# Patient Record
Sex: Female | Born: 2000 | Race: Black or African American | Hispanic: No | Marital: Single | Smoking: Never smoker
Health system: Southern US, Community
[De-identification: ages and names within clinical notes are randomized; demographics above are authoritative.]

## PROBLEM LIST (undated history)

## (undated) ENCOUNTER — Inpatient Hospital Stay (HOSPITAL_COMMUNITY): Payer: Self-pay

## (undated) DIAGNOSIS — N390 Urinary tract infection, site not specified: Secondary | ICD-10-CM

## (undated) DIAGNOSIS — I10 Essential (primary) hypertension: Secondary | ICD-10-CM

## (undated) DIAGNOSIS — A64 Unspecified sexually transmitted disease: Secondary | ICD-10-CM

## (undated) HISTORY — PX: NO PAST SURGERIES: SHX2092

## (undated) HISTORY — DX: Unspecified sexually transmitted disease: A64

---
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
## 2019-03-27 ENCOUNTER — Ambulatory Visit: Payer: Medicaid Other | Admitting: Family Medicine

## 2019-04-03 DIAGNOSIS — H5213 Myopia, bilateral: Secondary | ICD-10-CM | POA: Diagnosis not present

## 2019-04-03 DIAGNOSIS — H52223 Regular astigmatism, bilateral: Secondary | ICD-10-CM | POA: Diagnosis not present

## 2019-05-29 ENCOUNTER — Ambulatory Visit (INDEPENDENT_AMBULATORY_CARE_PROVIDER_SITE_OTHER): Payer: Medicaid Other | Admitting: Student in an Organized Health Care Education/Training Program

## 2019-05-29 ENCOUNTER — Other Ambulatory Visit (HOSPITAL_COMMUNITY)
Admission: RE | Admit: 2019-05-29 | Discharge: 2019-05-29 | Disposition: A | Discharge status: Home / Self Care | Payer: Medicaid Other | Source: Ambulatory Visit | Attending: Family Medicine | Admitting: Family Medicine

## 2019-05-29 ENCOUNTER — Other Ambulatory Visit: Payer: Self-pay

## 2019-05-29 VITALS — BP 120/80 | HR 88 | Ht 62.95 in | Wt 207.4 lb

## 2019-05-29 DIAGNOSIS — N926 Irregular menstruation, unspecified: Secondary | ICD-10-CM

## 2019-05-29 DIAGNOSIS — M549 Dorsalgia, unspecified: Secondary | ICD-10-CM

## 2019-05-29 DIAGNOSIS — N898 Other specified noninflammatory disorders of vagina: Secondary | ICD-10-CM | POA: Diagnosis not present

## 2019-05-29 DIAGNOSIS — G8929 Other chronic pain: Secondary | ICD-10-CM | POA: Diagnosis not present

## 2019-05-29 LAB — POCT WET PREP (WET MOUNT)
Clue Cells Wet Prep Whiff POC: POSITIVE
Trichomonas Wet Prep HPF POC: ABSENT

## 2019-05-29 LAB — POCT URINE PREGNANCY: Preg Test, Ur: NEGATIVE

## 2019-05-29 NOTE — Progress Notes (Signed)
   SUBJECTIVE:   CHIEF COMPLAINT / HPI: STD check, back pain  Back pain- present since epidural 1.5 years ago. Denies use of any medications to help. Located midline of entire spine. Improved when laying on her right side. Any type of activity worsens. This has impacted her daily activities. Her activities include daily squats without weight and crunches. Has worsened since onset. Denies rashes, paresthesias, radiation down legs or incontinence.   Vaginal discharge- intermittent over the past 2-3 months and resolves with use of "loose panties". Has a cleaning product odor. Denies any lesions unless she shaves her pubic hair. None are painful. Describes as clear. Denies pruritus. Regular periods, last one was in April sometime. Not concerned for pregnancy as her last intercourse was April 27th and was unprotected. She is not desiring pregnancy at this time. Does not take birth control as she thinks it makes her fat and lazy and gives her a rash on her thighs. Has PMH significant for gonorrhea.   PERTINENT  PMH / PSH: gonorrhea. Post-partum from cesarean delivery approximately 18 months ago.  OBJECTIVE:   BP 120/80   Pulse 88   Ht 5' 2.95" (1.599 m)   Wt 207 lb 6.4 oz (94.1 kg)   LMP 04/23/2019 (Approximate)   SpO2 100%   BMI 36.79 kg/m   General: NAD, pleasant, able to participate in exam Cardiac: RRR, normal heart sounds, no murmurs. 2+ radial and PT pulses bilaterally Respiratory: CTAB, normal effort, No wheezes, rales or rhonchi Abdomen: soft, nontender, nondistended, no hepatic or splenomegaly, +BS Back: negative for tenderness to palpation on paravertebral musculature. Tender to palpation of spinous processes from base of skull all the way to her sacrum. No rashes or abnormalities palpated. Endorses pain with movement but had full active ROM.  During pelvic exam, patient endorsed extreme discomfort in that position due to her back and had difficulty with laying still. Pelvic: negative  for external lesions.  Extremities: no edema or cyanosis. WWP. Skin: warm and dry, no rashes noted Neuro: alert and oriented x4, no focal deficits Psych: Normal affect and mood  ASSESSMENT/PLAN:   Back pain Highly unlikely 2/2 from epidural 1.5 years ago.  Given the nature of pain interfering with positioning for exam, ordering spine xray today.  Recommended trial of NSAIDs for symptomatic treatment  Vaginal discharge Exam positive for discharge. No active lesions. Urine preg screen neg - positive for chlamydia and BV. - prescribed azithro 1g and notify health department. Instructed to inform partners - prescribed metro 500 BID x7 days     Leeroy Bock, DO [facility]

## 2019-05-29 NOTE — Patient Instructions (Signed)
It was a pleasure to see you today!  To summarize our discussion for this visit:  We are testing for vaginal infections today. I will have results next week.  For your back, please go to hospital for xrays.   I also recommend you do back and core strengthening exercises to help prevent spinal pain  Some additional health maintenance measures we should update are: Health Maintenance Due  Topic Date Due  . COVID-19 Vaccine (1) Never done  .    Please return to our clinic to see [place] as needed.  Call the clinic at [phone] if your symptoms worsen or you have any concerns.   Thank you for allowing me to take part in your care,  Dr. Doristine Mango   Back Exercises These exercises help to make your trunk and back strong. They also help to keep the lower back flexible. Doing these exercises can help to prevent back pain or lessen existing pain.  If you have back pain, try to do these exercises 2-3 times each day or as told by your doctor.  As you get better, do the exercises once each day. Repeat the exercises more often as told by your doctor.  To stop back pain from coming back, do the exercises once each day, or as told by your doctor. Exercises Single knee to chest Do these steps 3-5 times in a row for each leg: 1. Lie on your back on a firm bed or the floor with your legs stretched out. 2. Bring one knee to your chest. 3. Grab your knee or thigh with both hands and hold them it in place. 4. Pull on your knee until you feel a gentle stretch in your lower back or buttocks. 5. Keep doing the stretch for 10-30 seconds. 6. Slowly let go of your leg and straighten it. Pelvic tilt Do these steps 5-10 times in a row: 1. Lie on your back on a firm bed or the floor with your legs stretched out. 2. Bend your knees so they point up to the ceiling. Your feet should be flat on the floor. 3. Tighten your lower belly (abdomen) muscles to press your lower back against the floor. This  will make your tailbone point up to the ceiling instead of pointing down to your feet or the floor. 4. Stay in this position for 5-10 seconds while you gently tighten your muscles and breathe evenly. Cat-cow Do these steps until your lower back bends more easily: 1. Get on your hands and knees on a firm surface. Keep your hands under your shoulders, and keep your knees under your hips. You may put padding under your knees. 2. Let your head hang down toward your chest. Tighten (contract) the muscles in your belly. Point your tailbone toward the floor so your lower back becomes rounded like the back of a cat. 3. Stay in this position for 5 seconds. 4. Slowly lift your head. Let the muscles of your belly relax. Point your tailbone up toward the ceiling so your back forms a sagging arch like the back of a cow. 5. Stay in this position for 5 seconds.  Press-ups Do these steps 5-10 times in a row: 1. Lie on your belly (face-down) on the floor. 2. Place your hands near your head, about shoulder-width apart. 3. While you keep your back relaxed and keep your hips on the floor, slowly straighten your arms to raise the top half of your body and lift your shoulders. Do not  use your back muscles. You may change where you place your hands in order to make yourself more comfortable. 4. Stay in this position for 5 seconds. 5. Slowly return to lying flat on the floor.  Bridges Do these steps 10 times in a row: 1. Lie on your back on a firm surface. 2. Bend your knees so they point up to the ceiling. Your feet should be flat on the floor. Your arms should be flat at your sides, next to your body. 3. Tighten your butt muscles and lift your butt off the floor until your waist is almost as high as your knees. If you do not feel the muscles working in your butt and the back of your thighs, slide your feet 1-2 inches farther away from your butt. 4. Stay in this position for 3-5 seconds. 5. Slowly lower your butt to  the floor, and let your butt muscles relax. If this exercise is too easy, try doing it with your arms crossed over your chest. Belly crunches Do these steps 5-10 times in a row: 1. Lie on your back on a firm bed or the floor with your legs stretched out. 2. Bend your knees so they point up to the ceiling. Your feet should be flat on the floor. 3. Cross your arms over your chest. 4. Tip your chin a little bit toward your chest but do not bend your neck. 5. Tighten your belly muscles and slowly raise your chest just enough to lift your shoulder blades a tiny bit off of the floor. Avoid raising your body higher than that, because it can put too much stress on your low back. 6. Slowly lower your chest and your head to the floor. Back lifts Do these steps 5-10 times in a row: 1. Lie on your belly (face-down) with your arms at your sides, and rest your forehead on the floor. 2. Tighten the muscles in your legs and your butt. 3. Slowly lift your chest off of the floor while you keep your hips on the floor. Keep the back of your head in line with the curve in your back. Look at the floor while you do this. 4. Stay in this position for 3-5 seconds. 5. Slowly lower your chest and your face to the floor. Contact a doctor if:  Your back pain gets a lot worse when you do an exercise.  Your back pain does not get better 2 hours after you exercise. If you have any of these problems, stop doing the exercises. Do not do them again unless your doctor says it is okay. Get help right away if:  You have sudden, very bad back pain. If this happens, stop doing the exercises. Do not do them again unless your doctor says it is okay. This information is not intended to replace advice given to you by your health care provider. Make sure you discuss any questions you have with your health care provider. Document Revised: 10/03/2017 Document Reviewed: 10/03/2017 Elsevier Patient Education  2020 [employer].

## 2019-06-02 LAB — CERVICOVAGINAL ANCILLARY ONLY
Chlamydia: POSITIVE — AB
Comment: NEGATIVE
Comment: NORMAL
Neisseria Gonorrhea: NEGATIVE

## 2019-06-03 DIAGNOSIS — M549 Dorsalgia, unspecified: Secondary | ICD-10-CM | POA: Insufficient documentation

## 2019-06-03 MED ORDER — AZITHROMYCIN 500 MG PO TABS: 1000.0000 mg | ORAL_TABLET | Freq: Every day | ORAL | 0 refills | Status: DC

## 2019-06-03 MED ORDER — METRONIDAZOLE 500 MG PO TABS: 500.0000 mg | ORAL_TABLET | Freq: Three times a day (TID) | ORAL | 0 refills | Status: DC

## 2019-06-03 NOTE — Assessment & Plan Note (Addendum)
Exam positive for discharge. No active lesions. Urine preg screen neg - positive for chlamydia and BV. - prescribed azithro 1g and notify health department. Instructed to inform partners - prescribed metro 500 BID x7 days

## 2019-06-03 NOTE — Assessment & Plan Note (Signed)
Highly unlikely 2/2 from epidural 1.5 years ago.  Given the nature of pain interfering with positioning for exam, ordering spine xray today.  Recommended trial of NSAIDs for symptomatic treatment

## 2019-07-03 ENCOUNTER — Telehealth: Payer: Self-pay

## 2019-07-03 NOTE — Telephone Encounter (Signed)
Patient calls nurse line stating she took a Plan B after unprotected intercourse, however, "was told she would start her period immediately and she has not." Patient reports she had unprotected IC on Saturday and took Plan B within the 48-72 hour time frame. Patient states one of her friends told her she would immediately start her period after taking. Patient is now concerned it did not work. Patients last LMP 06/17/2019. I advised patient to wait and see if she misses June period, and I am unaware if what her friend told her is true. However, every woman is different and may not have same experiences with medications. Patient advised to be patient and to call us if she misses her period, or has a positive home test. Patient agreed with plan.

## 2019-09-10 ENCOUNTER — Ambulatory Visit: Payer: Medicaid Other

## 2019-09-15 ENCOUNTER — Telehealth: Payer: Self-pay

## 2019-09-15 NOTE — Telephone Encounter (Signed)
Patient calls nurse line regarding late menstrual period. Patient reports LMP on 08/13/2019. Patient reports unprotected sex on 08/26/2019. Patient endorses nausea and headaches. Denies abdominal pain or cramps. Patient reports light pink discharge today but has not started a normal flow. Patient has not taken home pregnancy test yet.   Advised patient to take home pregnancy test. Patient requested to schedule appointment for evaluation. Scheduled in ATC for Thursday morning. Strict ED/ return precautions given.   To PCP  Veronda Prude, RN

## 2019-09-17 ENCOUNTER — Ambulatory Visit (INDEPENDENT_AMBULATORY_CARE_PROVIDER_SITE_OTHER): Payer: Medicaid Other | Admitting: Family Medicine

## 2019-09-17 ENCOUNTER — Other Ambulatory Visit: Payer: Self-pay

## 2019-09-17 VITALS — BP 124/86 | HR 106 | Ht 62.95 in | Wt 206.4 lb

## 2019-09-17 DIAGNOSIS — N926 Irregular menstruation, unspecified: Secondary | ICD-10-CM | POA: Diagnosis not present

## 2019-09-17 DIAGNOSIS — M549 Dorsalgia, unspecified: Secondary | ICD-10-CM

## 2019-09-17 LAB — POCT URINALYSIS DIP (MANUAL ENTRY)
Bilirubin, UA: NEGATIVE
Glucose, UA: NEGATIVE mg/dL
Ketones, POC UA: NEGATIVE mg/dL
Nitrite, UA: NEGATIVE
Protein Ur, POC: NEGATIVE mg/dL
Spec Grav, UA: 1.025 (ref 1.010–1.025)
Urobilinogen, UA: 0.2 E.U./dL
pH, UA: 5.5 (ref 5.0–8.0)

## 2019-09-17 LAB — POCT UA - MICROSCOPIC ONLY: Epithelial cells, urine per micros: >20

## 2019-09-17 LAB — POCT URINE PREGNANCY: Preg Test, Ur: NEGATIVE

## 2019-09-17 NOTE — Patient Instructions (Signed)
Thank you for coming to see me today. It was a pleasure. Today we talked about:   You are not pregnant.  Call if you have burning with urination, fever, chills, or your back pain doesn't improve.  If you have any questions or concerns, please do not hesitate to call the office at [phone].  Best,   Luis Abed, DO

## 2019-09-17 NOTE — Assessment & Plan Note (Signed)
Now has had period, and Upreg today negative.  Intercourse on 8/4, therefore this is true negative.  Advised patient of this who asked if serum was needed, but advised that given timing and period, she would not. Upreg is sensitive test for pregnancy.  Patient asked if there is a problem since she thought she was ovulating during intercourse on the 4th, but advised that tracking with a phone app is likely not accurate and that best way to tell if ovulating would be with testing temperature, monitoring discharge, and using ovulation tests.  Would not be concern about ability to conceive unless trying for 1 year without success.  She was reassured by this.  Advised that a period "2 days late" is not abnormal.

## 2019-09-17 NOTE — Assessment & Plan Note (Signed)
No CVA tenderness, urinary symptoms, fevers.  Pyelonephritis and UTI less likely.  Not clear UTI on UA and patient without symptoms aside from some back pain while on period, therefore will not treat.  Gave return precautions if urinary symptoms occur, has fever, back pain doesn't improve after period.  She voiced understanding.

## 2019-09-17 NOTE — Progress Notes (Signed)
    SUBJECTIVE:   CHIEF COMPLAINT / HPI:   Late Period Patient reports that she was 2 days late LMP 8/24, period before that was 7/22 Her period has been light She has been having low back cramps, headaches, and cramping Her periods are usually regular Last intercourse was 8/4 and didn't use protection She is trying to get pregnant No fevers, dysuria, hematuria, increased frequency or urgency  PERTINENT  PMH / PSH: GERD, PPD, S/P LTCS 2/2 breech presentation  OBJECTIVE:   BP 124/86   Pulse (!) 106   Ht 5' 2.95" (1.599 m)   Wt 206 lb 6.4 oz (93.6 kg)   LMP 09/16/2019 (Approximate)   SpO2 98%   BMI 36.62 kg/m    Physical Exam:  General: 19 y.o. female in NAD Cardio: RRR no m/r/g, HR 80s on exam Lungs: CTAB, no wheezing, no rhonchi, no crackles, no IWOB on RA Abdomen: Soft, non-tender to palpation, non-distended, positive bowel sounds, no CVA tenderness Skin: warm and dry Extremities: No edema   Results for orders placed or performed in visit on 09/17/19 (from the past 24 hour(s))  POCT urine pregnancy     Status: None   Collection Time: 09/17/19  9:50 AM  Result Value Ref Range   Preg Test, Ur Negative Negative  POCT urinalysis dipstick     Status: Abnormal   Collection Time: 09/17/19  9:50 AM  Result Value Ref Range   Color, UA yellow yellow   Clarity, UA cloudy (A) clear   Glucose, UA negative negative mg/dL   Bilirubin, UA negative negative   Ketones, POC UA negative negative mg/dL   Spec Grav, UA 3.810 1.751 - 1.025   Blood, UA large (A) negative   pH, UA 5.5 5.0 - 8.0   Protein Ur, POC negative negative mg/dL   Urobilinogen, UA 0.2 0.2 or 1.0 E.U./dL   Nitrite, UA Negative Negative   Leukocytes, UA Moderate (2+) (A) Negative  POCT UA - Microscopic Only     Status: Abnormal   Collection Time: 09/17/19  9:50 AM  Result Value Ref Range   WBC, Ur, HPF, POC 5-10    RBC, urine, microscopic OCCASIONAL    Bacteria, U Microscopic FEW    Epithelial cells,  urine per micros >20      ASSESSMENT/PLAN:   Late period Now has had period, and Upreg today negative.  Intercourse on 8/4, therefore this is true negative.  Advised patient of this who asked if serum was needed, but advised that given timing and period, she would not. Upreg is sensitive test for pregnancy.  Patient asked if there is a problem since she thought she was ovulating during intercourse on the 4th, but advised that tracking with a phone app is likely not accurate and that best way to tell if ovulating would be with testing temperature, monitoring discharge, and using ovulation tests.  Would not be concern about ability to conceive unless trying for 1 year without success.  She was reassured by this.  Advised that a period "2 days late" is not abnormal.  Back pain No CVA tenderness, urinary symptoms, fevers.  Pyelonephritis and UTI less likely.  Not clear UTI on UA and patient without symptoms aside from some back pain while on period, therefore will not treat.  Gave return precautions if urinary symptoms occur, has fever, back pain doesn't improve after period.  She voiced understanding.     Unknown Jim, DO [facility]

## 2019-09-25 ENCOUNTER — Ambulatory Visit: Payer: Medicaid Other | Admitting: Family Medicine

## 2019-10-06 ENCOUNTER — Ambulatory Visit: Payer: Medicaid Other | Admitting: Student in an Organized Health Care Education/Training Program

## 2019-10-13 DIAGNOSIS — Z20828 Contact with and (suspected) exposure to other viral communicable diseases: Secondary | ICD-10-CM | POA: Diagnosis not present

## 2019-10-17 ENCOUNTER — Emergency Department (HOSPITAL_COMMUNITY)
Admission: EM | Admit: 2019-10-17 | Discharge: 2019-10-17 | Disposition: A | Discharge status: Home / Self Care | Payer: Medicaid Other | Attending: Emergency Medicine | Admitting: Emergency Medicine

## 2019-10-17 ENCOUNTER — Other Ambulatory Visit: Payer: Self-pay

## 2019-10-17 ENCOUNTER — Encounter (HOSPITAL_COMMUNITY): Payer: Self-pay | Admitting: *Deleted

## 2019-10-17 DIAGNOSIS — M791 Myalgia, unspecified site: Secondary | ICD-10-CM | POA: Diagnosis present

## 2019-10-17 DIAGNOSIS — U071 COVID-19: Secondary | ICD-10-CM | POA: Diagnosis not present

## 2019-10-17 LAB — CBC
HCT: 44.8 % (ref 36.0–46.0)
Hemoglobin: 14.9 g/dL (ref 12.0–15.0)
MCH: 30.4 pg (ref 26.0–34.0)
MCHC: 33.3 g/dL (ref 30.0–36.0)
MCV: 91.4 fL (ref 80.0–100.0)
Platelets: 241 10*3/uL (ref 150–400)
RBC: 4.9 MIL/uL (ref 3.87–5.11)
RDW: 12.3 % (ref 11.5–15.5)
WBC: 4.5 10*3/uL (ref 4.0–10.5)
nRBC: 0 % (ref 0.0–0.2)

## 2019-10-17 LAB — BASIC METABOLIC PANEL
Anion gap: 10 (ref 5–15)
BUN: 8 mg/dL (ref 6–20)
CO2: 25 mmol/L (ref 22–32)
Calcium: 9.2 mg/dL (ref 8.9–10.3)
Chloride: 104 mmol/L (ref 98–111)
Creatinine, Ser: 0.88 mg/dL (ref 0.44–1.00)
GFR calc Af Amer: >60 mL/min (ref >60)
GFR calc non Af Amer: >60 mL/min (ref >60)
Glucose, Bld: 115 mg/dL — ABNORMAL HIGH (ref 70–99)
Potassium: 3.4 mmol/L — ABNORMAL LOW (ref 3.5–5.1)
Sodium: 139 mmol/L (ref 135–145)

## 2019-10-17 LAB — RESPIRATORY PANEL BY RT PCR (FLU A&B, COVID)
Influenza A by PCR: NEGATIVE
Influenza B by PCR: NEGATIVE
SARS Coronavirus 2 by RT PCR: POSITIVE — AB

## 2019-10-17 LAB — URINALYSIS, ROUTINE W REFLEX MICROSCOPIC
Bilirubin Urine: NEGATIVE
Glucose, UA: NEGATIVE mg/dL
Ketones, ur: NEGATIVE mg/dL
Nitrite: NEGATIVE
Protein, ur: 30 mg/dL — AB
Specific Gravity, Urine: 1.031 — ABNORMAL HIGH (ref 1.005–1.030)
pH: 5 (ref 5.0–8.0)

## 2019-10-17 LAB — I-STAT BETA HCG BLOOD, ED (MC, WL, AP ONLY): I-stat hCG, quantitative: <5 m[IU]/mL (ref <5)

## 2019-10-17 NOTE — ED Notes (Signed)
Patient Alert and oriented to baseline. Stable and ambulatory to baseline. Patient verbalized understanding of the discharge instructions.  Patient belongings were taken by the patient.   

## 2019-10-17 NOTE — ED Triage Notes (Signed)
COVID positive

## 2019-10-17 NOTE — ED Provider Notes (Signed)
[facility] EMERGENCY DEPARTMENT Provider Note   CSN: [id number] Arrival date & time: 10/17/19  2774     History Chief Complaint  Patient presents with   Generalized Body Aches    Theresa Conner is a 19 y.o. female presenting for evaluation of generalized body aches, cough, shortness of breath, loss of taste and smell.  Patient states her symptoms began 6 days ago.  She reports a mildly productive cough, producing mucus.  She has associated shortness of breath, which she describes as feeling congested.  She lost taste and smell yesterday.  She has taken NyQuil x1 for her symptoms, has not tried anything else.  She reports no other medical problems, takes medications daily.  No history of lung problems.  She has multiple contacts were also Covid positive.  She did not receive the Covid vaccine.  She denies fever, chest pain, nausea, vomiting, abd pain, urinary symptoms, abnormal bowel movements.  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   Status post primary low transverse cesarean section 12/23/2017   Preterm labor 12/20/2017   Breech presentation of fetus 12/20/2017   Supervision of normal first teen pregnancy 10/31/2017   History of STI 10/31/2017   Constipation 12/24/2014   GERD (gastroesophageal reflux disease) 11/12/2014   Vaginal discharge 09/23/2014   Overweight 10/03/2010    Past Surgical History:  Procedure Laterality Date   CESAREAN SECTION N/A 12/20/2017   Procedure: CESAREAN SECTION;  Surgeon: Tereso Newcomer, MD;  Location: [facility];  Service: Obstetrics;  Laterality: N/A;   NO  PAST SURGERIES       OB History    Gravida  1   Para  0   Term  0   Preterm  0   AB  0   Living  0     SAB      TAB      Ectopic      Multiple      Live Births              Family History  Problem Relation Age of Onset   Cancer Maternal Aunt        ovarian   Cancer Maternal Grandmother     Social History   Tobacco Use   Smoking status: Never Smoker   Smokeless tobacco: Never Used  Vaping Use   Vaping Use: Never used  Substance Use Topics   Alcohol use: No   Drug use: No    Home Medications Prior to Admission medications   Medication Sig Start Date End Date Taking? Authorizing Provider  azithromycin (ZITHROMAX) 500 MG tablet Take 2 tablets (1,000 mg total) by mouth daily. 06/03/19   Anderson, Chelsey L, DO  ibuprofen (ADVIL,MOTRIN) 800 MG tablet Take 1 tablet (800 mg total) by mouth every 8 (eight) hours as needed. 02/14/18   Mirian Mo, MD  metroNIDAZOLE (FLAGYL) 500 MG tablet Take 1 tablet (500 mg total) by mouth 3 (three) times daily. 06/03/19   Anderson, Chelsey L, DO  naproxen (NAPROSYN) 500 MG tablet Take 1 tablet (500 mg total) by mouth 2 (two) times daily as  needed. 09/30/18   Allayne Stack, DO  oxyCODONE (ROXICODONE) 5 MG/5ML solution Take 5 mLs (5 mg total) by mouth every 4 (four) hours as needed (For pain scale 4-7). 12/23/17   Leftwich-Kirby, Wilmer Floor, CNM  Prenatal Vit-Fe Phos-FA-Omega (VITAFOL GUMMIES) 3.33-0.333-34.8 MG CHEW Chew 1 tablet by mouth daily. 09/30/18   Allayne Stack, DO    Allergies    Other  Review of Systems   Review of Systems  HENT: Positive for congestion.   Respiratory: Positive for cough and shortness of breath.   Musculoskeletal: Positive for myalgias.  All other systems reviewed and are negative.   Physical Exam Updated Vital Signs BP (!) 120/102 (BP Location: Right Arm)    Pulse (!) 101    Temp 98.1 F (36.7 C) (Oral)    Resp 15    Ht 5\' 1"  (1.549 m)    Wt 94.3 kg    SpO2 97%    BMI 39.30 kg/m    Physical Exam Vitals and nursing note reviewed.  Constitutional:      General: She is not in acute distress.    Appearance: She is well-developed. She is obese.     Comments: Resting in the bed in no acute distress  HENT:     Head: Normocephalic and atraumatic.  Cardiovascular:     Rate and Rhythm: Normal rate and regular rhythm.     Pulses: Normal pulses.     Comments: Heart rate normal on my exam Pulmonary:     Effort: Pulmonary effort is normal.     Breath sounds: Normal breath sounds.     Comments: Speaking in full sentences.  Clear lung sounds in all fields.  Sats stable on room air. Abdominal:     General: There is no distension.     Palpations: There is no mass.     Tenderness: There is no abdominal tenderness. There is no guarding or rebound.  Musculoskeletal:        General: Normal range of motion.     Cervical back: Normal range of motion.     Right lower leg: No edema.     Left lower leg: No edema.  Skin:    General: Skin is warm.     Capillary Refill: Capillary refill takes less than 2 seconds.     Findings: No rash.  Neurological:     Mental Status: She is alert and oriented to person, place, and time.     ED Results / Procedures / Treatments   Labs (all labs ordered are listed, but only abnormal results are displayed) Labs Reviewed  RESPIRATORY PANEL BY RT PCR (FLU A&B, COVID) - Abnormal; Notable for the following components:      Result Value   SARS Coronavirus 2 by RT PCR POSITIVE (*)    All other components within normal limits  URINALYSIS, ROUTINE W REFLEX MICROSCOPIC - Abnormal; Notable for the following components:   Color, Urine AMBER (*)    APPearance HAZY (*)    Specific Gravity, Urine 1.031 (*)    Hgb urine dipstick SMALL (*)    Protein, ur 30 (*)    Leukocytes,Ua MODERATE (*)    Bacteria, UA RARE (*)    All other components within normal limits  BASIC METABOLIC PANEL - Abnormal; Notable for the following components:   Potassium 3.4 (*)     Glucose, Bld 115 (*)    All other components within normal limits  URINE CULTURE  CBC  I-STAT BETA HCG  BLOOD, ED ([facility], [facility], [facility] ONLY)    EKG None  Radiology No results found.  Procedures Procedures (including critical care time)  Medications Ordered in ED Medications - No data to display  ED Course  I have reviewed the triage vital signs and the nursing notes.  Pertinent labs & imaging results that were available during my care of the patient were reviewed by me and considered in my medical decision making (see chart for details).    MDM Rules/Calculators/A&P                          Patient presenting for evaluation of 6-day history of Covid symptoms.  On exam, patient appears nontoxic.  Pulmonary exam is overall reassuring.  Sats are stable.  I do not leave he needs admission to the hospital for Covid.  Low suspicion for PE.  Labs obtained from triage overall reassuring.  Patient's UA is indeterminate, it is a dirty sample with 11-20 squamous cells.  Will send for culture, although feel it would be unlikely to have a UTI and Covid at the same time.  Patient's Covid test is positive, discussed findings with patient.  In the setting of a reassuring pulmonary exam, I do not believe she needs an x-ray, as even if there are opacities will treat as a viral pneumonia without antibiotics.  Patient requesting a pregnancy test, pending.  Due to patient's obesity, she will qualify for monoclonal antibody treatment.  I discussed this option with patient, she states she does not want this, as such we will not refer.  Pt pregnancy test is negative.  Discussed findings with patient.  Discussed symptomatic treatment.  Encourage close monitoring of respiratory status.  At this time, patient appear safe for discharge.  Return precautions given.  Patient states she understands and agrees to plan.  Final Clinical Impression(s) / ED Diagnoses Final diagnoses:  COVID-19    Rx / DC Orders ED  Discharge Orders    None       Alveria Apley, PA-C 10/17/19 1045    Tegeler, Canary Brim, MD 10/17/19 1142

## 2019-10-17 NOTE — Discharge Instructions (Addendum)
Your pregnancy test was negative.  You have Covid, which is a viral illness.  This should be treated symptomatically. Use Tylenol or ibuprofen as needed for fever, body aches, or headaches. Make sure stay well-hydrated water. Use over-the-counter cough syrup/cough drops as needed. You will need to isolate at home for a total of 10 days from symptom onset.  This means you can end isolation after the 29th as long as you are fever free for 24 hours and your symptoms are not worsening. Anyone you have been in contact with will need to quarantine at home for a total of 10 days from when they last had contact with you and monitor for symptoms.  If they have been vaccinated, they do not need to quarantine. Return to the emergency room if you develop increased shortness of breath.  Return with any new, sudden, concerning symptoms.

## 2019-10-17 NOTE — ED Triage Notes (Signed)
C/o sorethroat and generalized bodyaches onset last week, states she loss her smell and taste yest.

## 2019-10-18 LAB — URINE CULTURE: Culture: <10000 — AB

## 2019-10-19 ENCOUNTER — Telehealth: Payer: Self-pay

## 2019-10-19 NOTE — Telephone Encounter (Signed)
Transition Care Management Follow-up Telephone Call  Date of discharge and from where: 10/17/2019 from   How have you been since you were released from the hospital? Patient is still feeling congested and states that she has a fever off and on.   Any questions or concerns? No  Items Reviewed:  Did the pt receive and understand the discharge instructions provided? Yes   Medications obtained and verified? Yes   Any new allergies since your discharge? Yes   Dietary orders reviewed? Yes  Do you have support at home? Yes   Functional Questionnaire: (I = Independent and D = Dependent) ADLs: I Bathing/Dressing- I Meal Prep- I Eating- I Maintaining continence- I Transferring/Ambulation- I Managing Meds- I  Follow up appointments reviewed:   PCP Hospital f/u appt confirmed? Specialist Hospital f/u appt confirmed? Patient stated that she will reach out to them if needed.   Are transportation arrangements needed? No   If their condition worsens, is the pt aware to call PCP or go to the Emergency Dept.? Yes  Was the patient provided with contact information for the PCP's office or ED? Yes  Was to pt encouraged to call back with questions or concerns? Yes

## 2019-10-22 ENCOUNTER — Telehealth: Payer: Self-pay | Admitting: *Deleted

## 2019-10-22 NOTE — Telephone Encounter (Signed)
Email sent to Family Medicine  to schedule follow up appointment .   Franki Stemen    PEC [phone] 

## 2020-01-06 ENCOUNTER — Encounter: Payer: Medicaid Other | Admitting: Family Medicine
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
## 2020-02-03 ENCOUNTER — Encounter: Payer: Medicaid Other | Admitting: Family Medicine

## 2020-03-25 ENCOUNTER — Ambulatory Visit: Payer: Medicaid Other

## 2020-04-01 ENCOUNTER — Ambulatory Visit: Payer: Medicaid Other | Admitting: Family Medicine

## 2020-04-27 ENCOUNTER — Ambulatory Visit: Payer: Medicaid Other | Admitting: Family Medicine

## 2020-05-11 ENCOUNTER — Encounter: Payer: Medicaid Other | Admitting: Family Medicine
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
## 2020-07-01 ENCOUNTER — Ambulatory Visit: Payer: Medicaid Other | Admitting: Family Medicine

## 2020-07-22 ENCOUNTER — Ambulatory Visit (INDEPENDENT_AMBULATORY_CARE_PROVIDER_SITE_OTHER): Payer: Medicaid Other | Admitting: Family Medicine

## 2020-07-22 ENCOUNTER — Other Ambulatory Visit: Payer: Self-pay

## 2020-07-22 VITALS — BP 127/91 | HR 98 | Temp 98.4°F | Ht 61.0 in

## 2020-07-22 DIAGNOSIS — Z3200 Encounter for pregnancy test, result unknown: Secondary | ICD-10-CM

## 2020-07-22 DIAGNOSIS — Z01812 Encounter for preprocedural laboratory examination: Secondary | ICD-10-CM | POA: Diagnosis not present

## 2020-07-22 DIAGNOSIS — B354 Tinea corporis: Secondary | ICD-10-CM | POA: Diagnosis not present

## 2020-07-22 DIAGNOSIS — Z20822 Contact with and (suspected) exposure to covid-19: Secondary | ICD-10-CM | POA: Diagnosis not present

## 2020-07-22 LAB — POCT URINE PREGNANCY: Preg Test, Ur: NEGATIVE

## 2020-07-22 MED ORDER — KETOCONAZOLE 2 % EX CREA: 1.0000 "application " | TOPICAL_CREAM | Freq: Every day | CUTANEOUS | 0 refills | Status: DC

## 2020-07-22 NOTE — Progress Notes (Signed)
   SUBJECTIVE:   CHIEF COMPLAINT / HPI:    Theresa Conner is a 20 y.o. female here for COVID testing as her requested COVID testing. Pt has rhinorrhea, intermittent loss of taste and sore throat for the past 2-3 weeks. No recent sick contacts. Pt not COVID vaccinated by choice. Pt has COVID in 9/202. Denies shortness of breath, chest pain, fatigue, vomiting, fever, diarrhea or chills.   Rash  Pt reports son had similar rash. Son sleeps in the same bed. Son has them on back and legs. Tried Bluestar ointment, co-q 10 without relief. Son's rash has resolved.   Pt reports last period 06/09/20. Had intercourse with partner but condom broke. Not taking prenatal vitamin.    PERTINENT  PMH / PSH: reviewed and updated as appropriate   OBJECTIVE:   BP (!) 127/91   Pulse 98   Temp 98.4 F (36.9 C) (Oral)   Ht 5\' 1"  (1.549 m)   LMP 06/09/2020   SpO2 98%   BMI 39.30 kg/m    GEN: well appearing female, in no acute distress  CV: regular rate and rhythm, no murmurs appreciated  RESP: no increased work of breathing, clear to ascultation bilaterally with no crackles, wheezes, or rhonchi  ABD: Bowel sounds present. Soft, Nontender, Nondistended.  MSK: no edema, or cyanosis noted SKIN: warm, dry   ASSESSMENT/PLAN:   No problem-specific Assessment & Plan notes found for this encounter.   Tinea corporis  Start ketoconazole 2% cream daily.  Follow in 6 weeks if not improved.   Missed period Urine pregnancy test negative. Advised barrier protections. Declines hormonal birth control.   COVID screening  Pt with sx of covid requiring testing prior to return to work. COVID test obtained. Advised to quarantine until test results. Pt agrees with plan.   06/11/2020, DO PGY-2,  Family Medicine 07/22/2020

## 2020-07-22 NOTE — Patient Instructions (Addendum)
It was great seeing Theresa Conner today!  Theresa Conner's COVID test is pending. You should COVID quartine until you get your test results and at least 5 days if you are positive.    For your rash: use the ketonazole twice a day until the rash resolves.    If you have questions or concerns please do not hesitate to call at [phone].  Dr. Katherina Right [facility]

## 2020-07-23 LAB — SARS-COV-2, NAA 2 DAY TAT

## 2020-07-23 LAB — NOVEL CORONAVIRUS, NAA: SARS-CoV-2, NAA: NOT DETECTED

## 2020-08-19 ENCOUNTER — Ambulatory Visit: Payer: Medicaid Other | Admitting: Family Medicine

## 2020-08-24 ENCOUNTER — Ambulatory Visit: Payer: Medicaid Other

## 2020-08-24 NOTE — Progress Notes (Deleted)
   SUBJECTIVE:   CHIEF COMPLAINT / HPI:   No chief complaint on file.    Theresa Conner is a 20 y.o. female here for ***   Pt reports ***    PERTINENT  PMH / PSH: reviewed and updated as appropriate   OBJECTIVE:   There were no vitals taken for this visit.  ***  ASSESSMENT/PLAN:   No problem-specific Assessment & Plan notes found for this encounter.     Katha Cabal, DO PGY-3, [facility] 08/24/2020      {    This will disappear when note is signed, click to select method of visit    :1}

## 2020-08-26 ENCOUNTER — Other Ambulatory Visit: Payer: Self-pay

## 2020-08-26 ENCOUNTER — Ambulatory Visit (INDEPENDENT_AMBULATORY_CARE_PROVIDER_SITE_OTHER): Payer: Medicaid Other | Admitting: Family Medicine

## 2020-08-26 DIAGNOSIS — Z6841 Body Mass Index (BMI) 40.0 and over, adult: Secondary | ICD-10-CM

## 2020-08-26 DIAGNOSIS — Z008 Encounter for other general examination: Secondary | ICD-10-CM

## 2020-08-26 NOTE — Patient Instructions (Addendum)
It was good to see you today.  Thank you for coming in.  I recommend giving [facility] a call to hlep with your weight loss.  Their number is [phone].  They are open 7:30-4:30 Monday through Thursday.  I recommend touching base with your OB/GYN about scheduling a Pap Smear now that you are 20.  Be Well, Dr Pecola Leisure

## 2020-08-27 DIAGNOSIS — Z6841 Body Mass Index (BMI) 40.0 and over, adult: Secondary | ICD-10-CM | POA: Insufficient documentation

## 2020-08-27 NOTE — Assessment & Plan Note (Signed)
Informed patient no pill that we recommend for losing weight alone.  Praised for efforts on limiting fast food .  Instructed patient to follow-up with [facility] for further extensive work with losing weight.  Given BMI of 40 patient should qualify. - Information given regarding [facility] and instructed patient to follow-up with

## 2020-08-27 NOTE — Progress Notes (Signed)
    SUBJECTIVE:   CHIEF COMPLAINT / HPI:   Patient here to discuss options for weight loss.  Has had trouble losing weight after C section in 2019.  Denies any other concerning symptoms.  Patient indicates has equipment she uses for exercise in home.  Also has cut back on fast food intake recently.  PERTINENT  PMH / PSH: C-section  OBJECTIVE:   BP 123/80   Pulse 71   Ht 5\' 1"  (1.549 m)   Wt 214 lb 9.6 oz (97.3 kg)   SpO2 99%   BMI 40.55 kg/m    Physical Exam HENT:     Head: Normocephalic and atraumatic.     Mouth/Throat:     Mouth: Mucous membranes are moist.  Skin:    Comments: Well healed C-section scar  Neurological:     General: No focal deficit present.     Mental Status: She is alert.     Gait: Gait normal.     ASSESSMENT/PLAN:   BMI 40.0-44.9, adult (HCC) Informed patient no pill that we recommend for losing weight alone.  Praised for efforts on limiting fast food .  Instructed patient to follow-up with [facility] for further extensive work with losing weight.  Given BMI of 40 patient should qualify. - Information given regarding [facility] and instructed patient to follow-up with   Health Maintenance - Instructed patient to follow-up with PCP for Pap Smear as now is 20 years old  36, MD [facility]

## 2020-09-13 ENCOUNTER — Encounter (INDEPENDENT_AMBULATORY_CARE_PROVIDER_SITE_OTHER): Payer: Self-pay

## 2020-10-05 ENCOUNTER — Ambulatory Visit: Payer: Medicaid Other | Admitting: Family Medicine

## 2020-12-07 DIAGNOSIS — Y999 Unspecified external cause status: Secondary | ICD-10-CM | POA: Diagnosis not present

## 2020-12-07 DIAGNOSIS — M545 Low back pain, unspecified: Secondary | ICD-10-CM | POA: Diagnosis not present

## 2020-12-07 DIAGNOSIS — Y92410 Unspecified street and highway as the place of occurrence of the external cause: Secondary | ICD-10-CM | POA: Diagnosis not present
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
## 2021-03-10 ENCOUNTER — Encounter: Payer: Self-pay | Admitting: Family Medicine

## 2021-03-10 ENCOUNTER — Other Ambulatory Visit: Payer: Self-pay

## 2021-03-10 ENCOUNTER — Ambulatory Visit (HOSPITAL_COMMUNITY)
Admission: RE | Admit: 2021-03-10 | Discharge: 2021-03-10 | Disposition: A | Discharge status: Home / Self Care | Payer: Medicaid Other | Source: Ambulatory Visit | Attending: Family Medicine | Admitting: Family Medicine

## 2021-03-10 ENCOUNTER — Ambulatory Visit (INDEPENDENT_AMBULATORY_CARE_PROVIDER_SITE_OTHER): Payer: Medicaid Other | Admitting: Family Medicine

## 2021-03-10 VITALS — BP 126/86 | HR 79 | Wt 220.0 lb

## 2021-03-10 DIAGNOSIS — M546 Pain in thoracic spine: Secondary | ICD-10-CM

## 2021-03-10 DIAGNOSIS — G8929 Other chronic pain: Secondary | ICD-10-CM | POA: Insufficient documentation

## 2021-03-10 DIAGNOSIS — M545 Low back pain, unspecified: Secondary | ICD-10-CM | POA: Diagnosis not present

## 2021-03-10 NOTE — Patient Instructions (Signed)
We will check some x-rays for your back pain.  I would like for you to go across the street to the front entrance of [facility] and tell them you are here for x-rays.  They will get your x-rays done and I will go over the results with you when they return.  If they are negative I will send you a MyChart message.  If they show anything we need to do anything about I will call you.  In the meantime you can use Tylenol, lidocaine patches, or naproxen as needed for the back pain.

## 2021-03-15 ENCOUNTER — Telehealth: Payer: Self-pay

## 2021-03-15 ENCOUNTER — Other Ambulatory Visit: Payer: Self-pay | Admitting: Family Medicine

## 2021-03-15 ENCOUNTER — Telehealth: Payer: Self-pay | Admitting: Family Medicine

## 2021-03-15 DIAGNOSIS — G8929 Other chronic pain: Secondary | ICD-10-CM

## 2021-03-15 NOTE — Telephone Encounter (Signed)
Returned patient's call.  She was interested in physical therapy referral which I placed.  I discussed with her that it can take several weeks or months worth of work for chronic back pain to improve via physical therapy.  I recommended that she go in to physical therapy with the mindset of trying to learn the exercises to do at home once or twice per day.  She expressed that she is still interested in meeting with her primary doctor to discuss options for weight loss such as referral or even medications.  I recommended that she set up that appointment at her convenience.  She is going to trial of physical therapy exercises for at least a month or so and then will let us know how they are doing.

## 2021-03-15 NOTE — Telephone Encounter (Signed)
Opened in error

## 2021-03-15 NOTE — Telephone Encounter (Signed)
Patient calls nurse line regarding results from X-rays. Patient is interested in pursuing options for physical therapy.   Will forward to Dr. Atha Starks (provider who saw patient for concern)  Patient would like returned call at [phone].  Veronda Prude, RN

## 2021-03-28 ENCOUNTER — Ambulatory Visit: Payer: Medicaid Other | Admitting: Family Medicine

## 2021-04-12 ENCOUNTER — Other Ambulatory Visit: Payer: Self-pay

## 2021-04-12 ENCOUNTER — Ambulatory Visit (INDEPENDENT_AMBULATORY_CARE_PROVIDER_SITE_OTHER): Payer: Medicaid Other | Admitting: Family Medicine

## 2021-04-12 VITALS — BP 129/85 | HR 74 | Wt 220.0 lb

## 2021-04-12 DIAGNOSIS — S46112A Strain of muscle, fascia and tendon of long head of biceps, left arm, initial encounter: Secondary | ICD-10-CM

## 2021-04-12 DIAGNOSIS — L249 Irritant contact dermatitis, unspecified cause: Secondary | ICD-10-CM | POA: Diagnosis not present

## 2021-04-12 MED ORDER — IBUPROFEN 800 MG PO TABS: 800.0000 mg | ORAL_TABLET | Freq: Three times a day (TID) | ORAL | 0 refills | Status: DC | PRN

## 2021-04-12 MED ORDER — TRIAMCINOLONE ACETONIDE 0.1 % EX OINT: 1.0000 "application " | TOPICAL_OINTMENT | Freq: Two times a day (BID) | CUTANEOUS | 0 refills | Status: AC

## 2021-04-12 NOTE — Assessment & Plan Note (Signed)
Patient appears to have some strain of left biceps tendon  ?Given exercises on handout for rehab  ?Prescribed ibuprofen ?Recommended continued movement of the extremity to prevent stiffness  ?Patient demonstrates range of motion and normal strength but with tenderness during these maneuvers  ?Recommended that patient rest from lifting at work ?Work note given to return in 3-4 days  ?

## 2021-04-12 NOTE — Progress Notes (Signed)
? ? ?  SUBJECTIVE:  ? ?CHIEF COMPLAINT / HPI: arm rash ? ?Rash ?Has been present for 1 week  ?Previously tried her son's steroid cream ?Previous occurrence: reports having previous rashes all over her body at different times  ?+Pruritic ?Associated symptoms: denies pain, bleeding or drainage ?Exposures unknown, patient denies changes to lotions  ?Allergic to ranch dressing ? ?Left arm injury ?Patient states that she hurt her arm while at work.  She reports that it is painful to bend her arm at the elbow.  Pain is mostly in her forearm.  She does not recall a specific injury or fall on his arm.  She denies weakness but states that she does have pain with movement so tries to limit range of motion. ? ?PERTINENT  PMH / PSH:  ?GERD ?BMI elevated ? ?OBJECTIVE:  ? ?BP 129/85   Pulse 74   Wt 220 lb (99.8 kg)   SpO2 98%   BMI 41.57 kg/m?   ? ?Physical Exam ?Musculoskeletal:  ?   Right forearm: Normal.  ?   Left forearm: Tenderness present. No swelling, edema, deformity, lacerations or bony tenderness.  ?   Comments: Normal ROM although tenderness with ROM flexion at elbow  ?5/5 strength  ?Some tenderness with supination and pronation   ?Skin: ?   General: Skin is warm and dry.  ?   Capillary Refill: Capillary refill takes less than 2 seconds.  ?   Coloration: Skin is not pale.  ?   Findings: Erythema and rash present. No wound. Rash is macular, papular and scaling. Rash is not crusting, nodular, purpuric, pustular or vesicular.  ?   Nails: There is no clubbing.  ? ?    ? ? ?ASSESSMENT/PLAN:  ? ?Irritant contact dermatitis ?Linear distribution of macular papular rash with high suspicion for contact dermatitis of unknown trigger  ?Will treat with triamcinolone 0.1% twice daily for 2 weeks and have patient follow up to check for resolution  ? ?Strain of muscle, fascia and tendon of long head of biceps, left arm, initial encounter ?Patient appears to have some strain of left biceps tendon  ?Given exercises on handout for  rehab  ?Prescribed ibuprofen ?Recommended continued movement of the extremity to prevent stiffness  ?Patient demonstrates range of motion and normal strength but with tenderness during these maneuvers  ?Recommended that patient rest from lifting at work ?Work note given to return in 3-4 days  ?  ? ? ?Ronnald Ramp, MD ?[facility]  ?

## 2021-04-12 NOTE — Patient Instructions (Addendum)
I have prescribed a cream for you to apply twice daily to the area for the next 2 weeks. ? ?I also recommend doing the attached exercises to help with the pain in your arm.  ? ?Distal Biceps Tendinitis Rehab ?Ask your health care provider which exercises are safe for you. Do exercises exactly as told by your health care provider and adjust them as directed. It is normal to feel mild stretching, pulling, tightness, or discomfort as you do these exercises. Stop right away if you feel sudden pain or your pain gets worse. Do not begin these exercises until told by your health care provider. ?Stretching and range-of-motion exercises ?These exercises warm up your muscles and joints and improve the movement and flexibility of your arm. These exercises can also help to relieve pain and stiffness. ?Elbow range of motion ?Stand or sit with your left / right elbow bent in a 90-degree angle (right angle). Position your forearm so that the thumb is facing the ceiling (neutral position). ?Slowly straighten your elbow until you feel a stretch. ?Hold this position for __________ seconds. ?Slowly bend your elbow until you feel a stretch, or until you touch your thumb to your shoulder. ?Hold this position for __________ seconds. ?Repeat __________ times. Complete this exercise __________ times a day. ?Forearm rotation, supination ?Stand up or sit with your left / right elbow bent in a 90-degree angle (right angle). ?Rotate your palm up until you cannot rotate it anymore (supination). Then, use your other hand to help turn your left / right forearm more. ?Hold this position for __________ seconds. ?Slowly return to the starting position. ?Repeat __________ times. Complete this exercise __________ times a day. ?Forearm rotation, pronation ?Stand up or sit with your left / right elbow bent in a 90-degree angle (right angle). ?Turn (rotate) your left / right palm down (pronation) until you cannot rotate it anymore. Then, use your other  hand to help turn your left / right forearm more. ?Hold this position for __________ seconds. ?Slowly return to the starting position. ?Repeat __________ times. Complete this exercise __________ times a day. ?Biceps stretch ?Stand by a door frame. ?Place your left / right hand on the door frame. Keep your elbow straight during the exercise. Gently turn your body toward the opposite side until you feel a gentle stretch. ?Hold this position for __________ seconds. ?Slowly return to the starting position. ?Repeat __________ times. Complete this exercise __________ times a day. ?Strengthening exercises ?These exercises build strength and endurance in your arm and shoulder. Endurance is the ability to use your muscles for a long time, even after they get tired. ?Forearm rotation, supination ? ?Sit with your left / right forearm supported on a table. Your elbow should be at waist height. ?Gently grasp a lightweight hammer near the head. As this exercise gets easier for you, try holding the hammer farther down the handle. ?Rest your hand over the edge of the table, palm down. ?Without moving your left / right elbow, slowly rotate your palm up (supination), stopping when your thumb is pointed toward the ceiling. ?Hold this position for__________ seconds. ?Slowly return to the starting position. ?Repeat __________ times. Complete this exercise __________ times a day. ?Forearm rotation, pronation ? ?Sit with your left / right forearm supported on a table. Your elbow should be at waist height. ?Gently grasp a lightweight hammer near the head. As this exercise gets easier for you, try holding the hammer farther down the handle. ?Rest your hand over the edge  of the table, palm up. ?Without moving your left / right elbow, slowly rotate your palm down (pronation), stopping when your thumb is pointed toward the floor. ?Hold this position for __________ seconds. ?Slowly return to the starting position. ?Repeat __________ times.  Complete this exercise __________ times a day. ?Biceps curls ? ?Sit on a stable chair without armrests, or stand up. ?If directed, hold a __________ lb / kg weight in your left / right hand, or hold an exercise band with both hands. Your palms should face up toward the ceiling at the starting position. ?Bend your left / right elbow and move your hand up toward your shoulder. Keep your other arm straight down, in the starting position. ?Slowly return to the starting position. ?Repeat __________ times. Complete this exercise __________ times a day. ?Elbow extension, supine ? ?Lie on your back (supine position). ?Hold a __________ lb / kg weight in your left / right hand. ?Bend your left / right elbow to a 90-degree angle (right angle) so the weight is in front of your face, over your chest, and your elbow is pointed up to the ceiling. ?Straighten your elbow, raising your hand toward the ceiling (extension). Use your other hand to support your left / right upper arm and to keep it still. ?Slowly return to the starting position. ?Repeat __________ times. Complete this exercise __________ times a day. ?This information is not intended to replace advice given to you by your health care provider. Make sure you discuss any questions you have with your health care provider. ?Document Revised: 03/03/2020 Document Reviewed: 03/03/2020 ?Elsevier Patient Education ? 2022 Elsevier Inc. ? ? ?

## 2021-04-12 NOTE — Assessment & Plan Note (Signed)
Linear distribution of macular papular rash with high suspicion for contact dermatitis of unknown trigger  ?Will treat with triamcinolone 0.1% twice daily for 2 weeks and have patient follow up to check for resolution  ?

## 2021-04-18 ENCOUNTER — Other Ambulatory Visit: Payer: Self-pay | Admitting: Family Medicine

## 2021-04-18 DIAGNOSIS — G8929 Other chronic pain: Secondary | ICD-10-CM

## 2021-04-20 ENCOUNTER — Ambulatory Visit: Payer: Medicaid Other | Attending: Family Medicine | Admitting: Physical Therapy

## 2021-05-04 ENCOUNTER — Ambulatory Visit: Payer: Medicaid Other | Admitting: Family Medicine

## 2021-05-09 ENCOUNTER — Encounter: Payer: Self-pay | Admitting: Family Medicine

## 2021-05-09 ENCOUNTER — Ambulatory Visit (INDEPENDENT_AMBULATORY_CARE_PROVIDER_SITE_OTHER): Payer: Medicaid Other | Admitting: Family Medicine

## 2021-05-09 MED ORDER — OZEMPIC (0.25 OR 0.5 MG/DOSE) 2 MG/1.5ML ~~LOC~~ SOPN: 0.2500 mg | PEN_INJECTOR | SUBCUTANEOUS | 0 refills | Status: AC

## 2021-05-09 NOTE — Progress Notes (Signed)
? ? ?  SUBJECTIVE:  ? ?CHIEF COMPLAINT / HPI:  ? ?Theresa Conner is a 21 year old who presents to discuss weight loss  ? ?She states she has used priobiotic gummies, drinking more water. Has been trying intermittent fasting. States she eats 1-2 meals a day. Eats a lot of fruits and veggies. Has been eating cucumbers or broccoli with each meal. Eats protein bread. Does drink occasional brisk tea and a soda. Does drink flavored waters. Sometimes works out at [employer], walks, does stairs  ? ?In Dec 2019 was 163 lbs. In 2016 weighed 136 lbs  ?Had a lot of stressors after birth of her child. Endorses mood is good  ? ? ?OBJECTIVE:  ? ?BP 130/80   Pulse 80   Ht 5\' 1"  (1.549 m)   Wt 221 lb 6.4 oz (100.4 kg)   LMP 05/01/2021   SpO2 99%   BMI 41.83 kg/m?   ? ?Physical exam ? ?General: well appearing, NAD ?Cardiovascular: RRR, no murmurs ?Lungs: CTAB. Normal WOB ?Abdomen: soft, non-distended, non-tender ?Skin: warm, dry. No edema ? ? ?ASSESSMENT/PLAN:  ? ?Morbid obesity (HCC) ?BMI 41.83. Has tried making changes to her diet and incorporating more physical activity but looking to start medication.Goal is to get to her pre pregnancy weight. Starting patient on Ozempic .25mg  weekly. Advised to follow up in about 2 weeks to ensure she is tolerating medication and if so will increase dose to .5mg  next month. Encouraged to continue eating healthy and working out for best results. Will also check A1c, BMP, lipid panel.  ? ? ?07/01/2021, DO ?[facility]   ?

## 2021-05-09 NOTE — Patient Instructions (Signed)
It was great seeing you today! ? ?You came in to discuss weight loss, and we are starting you on Ozempic 0.25 mg weekly and will increase the dose if you are able to tolerate it.  Continue eating well-balanced meals, incorporating vegetables with every meal and reducing the amount of sugar you drink.  Continue exercising as well. ? ?Please check-out at the front desk before leaving the clinic. I'd like to see you back in 2 weeks after starting the medication to make sure you are able to tolerate it, but if you need to be seen earlier than that for any new issues we're happy to fit you in, just give [place] a call! ? ?Feel free to call with any questions or concerns at any time, at [phone]. ?  ?Take care,  ?Dr. Cora Collum ?[facility]  ?

## 2021-05-10 ENCOUNTER — Telehealth: Payer: Self-pay

## 2021-05-10 LAB — LIPID PANEL
Chol/HDL Ratio: 5.2 ratio — ABNORMAL HIGH (ref 0.0–4.4)
Cholesterol, Total: 203 mg/dL — ABNORMAL HIGH (ref 100–199)
HDL: 39 mg/dL — ABNORMAL LOW (ref >39)
LDL Chol Calc (NIH): 151 mg/dL — ABNORMAL HIGH (ref 0–99)
Triglycerides: 73 mg/dL (ref 0–149)
VLDL Cholesterol Cal: 13 mg/dL (ref 5–40)

## 2021-05-10 LAB — BASIC METABOLIC PANEL
BUN/Creatinine Ratio: 13 (ref 9–23)
BUN: 11 mg/dL (ref 6–20)
CO2: 23 mmol/L (ref 20–29)
Calcium: 9.8 mg/dL (ref 8.7–10.2)
Chloride: 103 mmol/L (ref 96–106)
Creatinine, Ser: 0.82 mg/dL (ref 0.57–1.00)
Glucose: 90 mg/dL (ref 70–99)
Potassium: 4.7 mmol/L (ref 3.5–5.2)
Sodium: 142 mmol/L (ref 134–144)
eGFR: 104 mL/min/{1.73_m2} (ref >59)

## 2021-05-10 LAB — HEMOGLOBIN A1C
Est. average glucose Bld gHb Est-mCnc: 111 mg/dL
Hgb A1c MFr Bld: 5.5 % (ref 4.8–5.6)

## 2021-05-10 NOTE — Telephone Encounter (Signed)
Patient LVM on nurse line requesting recent lab work.  ? ?Please advise.  ?

## 2021-05-12 NOTE — Assessment & Plan Note (Signed)
BMI 41.83. Has tried making changes to her diet and incorporating more physical activity but looking to start medication.Goal is to get to her pre pregnancy weight. Starting patient on Ozempic .25mg  weekly. Advised to follow up in about 2 weeks to ensure she is tolerating medication and if so will increase dose to .5mg  next month. Encouraged to continue eating healthy and working out for best results. Will also check A1c, BMP, lipid panel.  ?

## 2021-05-15 ENCOUNTER — Telehealth: Payer: Self-pay

## 2021-05-15 NOTE — Telephone Encounter (Signed)
A Prior Authorization was initiated for this patients OZEMPIC through CoverMyMeds.  ? ?Key: [id number] ? ?

## 2021-05-16 NOTE — Telephone Encounter (Signed)
Prior Auth for patients medication OZEMPIC denied by [facility] via CoverMyMeds.  ? ?Reason: MAY BE CONSIDERED FOR APPROVAL WHEN USED IN CERTAIN SITUATIONS: WHEN YOU HAVE DIABETES WITH ASCVD, HEART FAILURE OR CKD, WHEN YOU HAVE A TRIAL & FAILURE OF METFORMIN OR A METFORMIN CONTAINING PRODUCT ? ?Denial letter scanned to pt's chart ? ?

## 2021-05-24 ENCOUNTER — Ambulatory Visit: Payer: Medicaid Other | Admitting: Family Medicine

## 2021-06-27 ENCOUNTER — Encounter: Payer: Self-pay | Admitting: *Deleted

## 2021-07-12 ENCOUNTER — Ambulatory Visit: Payer: Medicaid Other | Admitting: Family Medicine

## 2021-07-25 NOTE — Progress Notes (Deleted)
    SUBJECTIVE:   CHIEF COMPLAINT / HPI: STI testing   Patient presents for STI testing  She reports being currently sexually active  She does/not*** use barrier contraception  She uses *** for hormonal contraception   She is not aware of exposure to STI recently ***     PERTINENT  PMH / PSH: ***  OBJECTIVE:   There were no vitals taken for this visit.  Physical Exam   ASSESSMENT/PLAN:   No problem-specific Assessment & Plan notes found for this encounter.   Will collect aptima swab today  Offered prep therapy, patient declines ***   Ronnald Ramp, MD [facility]

## 2021-07-26 ENCOUNTER — Ambulatory Visit: Payer: Medicaid Other | Admitting: Family Medicine

## 2021-08-10 ENCOUNTER — Ambulatory Visit: Payer: Medicaid Other | Admitting: Family Medicine

## 2021-08-16 ENCOUNTER — Encounter: Payer: Self-pay | Admitting: Family Medicine

## 2021-08-16 ENCOUNTER — Ambulatory Visit (INDEPENDENT_AMBULATORY_CARE_PROVIDER_SITE_OTHER): Payer: Medicaid Other | Admitting: Family Medicine

## 2021-08-16 VITALS — BP 128/88 | HR 102 | Ht 61.0 in | Wt 219.0 lb

## 2021-08-16 DIAGNOSIS — N926 Irregular menstruation, unspecified: Secondary | ICD-10-CM | POA: Diagnosis not present

## 2021-08-16 LAB — POCT URINALYSIS DIP (MANUAL ENTRY)
Bilirubin, UA: NEGATIVE
Glucose, UA: NEGATIVE mg/dL
Ketones, POC UA: NEGATIVE mg/dL
Nitrite, UA: NEGATIVE
Spec Grav, UA: 1.025 (ref 1.010–1.025)
Urobilinogen, UA: 1 E.U./dL
pH, UA: 5.5 (ref 5.0–8.0)

## 2021-08-16 LAB — POCT UA - MICROSCOPIC ONLY: Epithelial cells, urine per micros: >20

## 2021-08-16 LAB — POCT URINE PREGNANCY: Preg Test, Ur: NEGATIVE

## 2021-08-16 NOTE — Patient Instructions (Addendum)
It was great seeing you today!  We are checking your urine and blood for pregnancy, I will call you when the blood results it can take a couple of days.    Please check-out at the front desk before leaving the clinic. I'd like to see you back in the next couple of weeks to do your Pap smear but if you need to be seen earlier than that for any new issues we're happy to fit you in, just give [place] a call!  Feel free to call with any questions or concerns at any time, at [phone].   Take care,  Dr. Cora Collum [facility]

## 2021-08-16 NOTE — Progress Notes (Signed)
    SUBJECTIVE:   CHIEF COMPLAINT / HPI:   Patient presents for pregnancy test. LMP 7/20 - 7/22 but was lighter than her typical cycles and is worried about pregnancy. Not on contraception but did take a morning after pill the day after intercourse. She also quickly mentions when outside of the room about feeling more tired and possible burning on urination which she thinks may be a symptom of pregnancy. She would prefer a blood test to confirm today.  Has not taken a home pregnancy test.  Accompanied by boyfriend and for personal reasons declines STI testing and pap smear today.  Will return when she is alone to get these done.   PERTINENT  PMH / PSH: Reviewed   OBJECTIVE:   BP 128/88   Pulse (!) 102   Ht 5\' 1"  (1.549 m)   Wt 219 lb (99.3 kg)   LMP 08/10/2021   SpO2 99%   BMI 41.38 kg/m    Physical exam General: well appearing, NAD Cardiovascular: RRR, no murmurs Lungs: CTAB. Normal WOB Abdomen: soft, non-distended, non-tender Skin: warm, dry. No edema  ASSESSMENT/PLAN:   Abnormal menses LMP 7/20 - 7/22 but was lighter than her typical cycles and she is worried about pregnancy. Decreased flow could be related to emergency contraception use. She would prefer a blood test to confirm today. She has been anxious about this which is likely why heart rate a bit elevated today. Will obtain beta hCG and U preg. Obtaining a UA given patient's complaint of dysuria. Declined pelvic exam, so patient agreed to return in the next couple of weeks for STI testing, pap. Will also discuss contraception management at that time.    09-08-1972, DO [facility]

## 2021-08-17 DIAGNOSIS — N939 Abnormal uterine and vaginal bleeding, unspecified: Secondary | ICD-10-CM | POA: Insufficient documentation

## 2021-08-17 LAB — BETA HCG QUANT (REF LAB): hCG Quant: <1 m[IU]/mL

## 2021-08-17 NOTE — Assessment & Plan Note (Signed)
LMP 7/20 - 7/22 but was lighter than her typical cycles and she is worried about pregnancy. Decreased flow could be related to emergency contraception use. She would prefer a blood test to confirm today. She has been anxious about this which is likely why heart rate a bit elevated today. Will obtain beta hCG and U preg. Obtaining a UA given patient's complaint of dysuria. Declined pelvic exam, so patient agreed to return in the next couple of weeks for STI testing, pap. Will also discuss contraception management at that time.

## 2021-08-17 NOTE — Assessment & Plan Note (Deleted)
LMP 7/20 - 7/22 but was lighter than her typical cycles and she is worried about pregnancy. Decreased flow could be related to emergency contraception use. She would prefer a blood test to confirm today. She has been anxious about this which is likely why heart rate a bit elevated today. Will obtain beta hCG and U preg. Obtaining a UA given patient's complaint of dysuria. Declined pelvic exam, so patient agreed to return in the next couple of weeks for STI testing, pap. Will also discuss contraception management at that time.  

## 2021-08-30 ENCOUNTER — Encounter (INDEPENDENT_AMBULATORY_CARE_PROVIDER_SITE_OTHER): Payer: Self-pay

## 2021-09-15 ENCOUNTER — Ambulatory Visit: Payer: Medicaid Other | Admitting: Family Medicine

## 2021-10-10 ENCOUNTER — Ambulatory Visit: Payer: Medicaid Other | Admitting: Family Medicine

## 2022-01-12 ENCOUNTER — Encounter: Payer: Self-pay | Admitting: Family Medicine

## 2022-01-12 ENCOUNTER — Other Ambulatory Visit (HOSPITAL_COMMUNITY)
Admission: RE | Admit: 2022-01-12 | Discharge: 2022-01-12 | Disposition: A | Discharge status: Home / Self Care | Payer: Medicaid Other | Source: Ambulatory Visit | Attending: Family Medicine | Admitting: Family Medicine

## 2022-01-12 ENCOUNTER — Ambulatory Visit (INDEPENDENT_AMBULATORY_CARE_PROVIDER_SITE_OTHER): Payer: Medicaid Other | Admitting: Family Medicine

## 2022-01-12 VITALS — BP 128/82 | HR 96 | Ht 61.0 in | Wt 216.6 lb

## 2022-01-12 DIAGNOSIS — Z1159 Encounter for screening for other viral diseases: Secondary | ICD-10-CM

## 2022-01-12 DIAGNOSIS — N898 Other specified noninflammatory disorders of vagina: Secondary | ICD-10-CM

## 2022-01-12 NOTE — Progress Notes (Unsigned)
    SUBJECTIVE:   CHIEF COMPLAINT / HPI:   Vaginal Discharge: Patient is a 21 y.o. female presenting with vaginal itching though she thinks its from shaving. Has some increased clear discharge and ordor for the past couple of weeks. No new partners. Normal cycles. No urinary symptoms.  She states the discharge is of *** consistency.  She endorses *** vaginal odor.  She is interested in screening for sexually transmitted infections today.  PERTINENT  PMH / PSH: ***None relevant  OBJECTIVE:   BP 128/82   Pulse 96   Ht 5\' 1"  (1.549 m)   Wt 216 lb 9.6 oz (98.2 kg)   LMP 01/09/2022 (Exact Date)   SpO2 99%   BMI 40.93 kg/m    General: NAD, pleasant, able to participate in exam Respiratory: Normal effort, no obvious respiratory distress Pelvic: VULVA: normal appearing vulva with no masses, tenderness or lesions, VAGINA: Normal appearing vagina with normal color, no lesions, with {GYN VAGINAL DISCHARGE:21986} discharge present, ***CERVIX: No lesions, {GYN VAGINAL DISCHARGE:21986} discharge present,  Chaperone *** present for pelvic exam  ASSESSMENT/PLAN:   No problem-specific Assessment & Plan notes found for this encounter.   Assessment:  21 y.o. female with vaginal discharge for***days, as well as***.  Physical exam significant for*** discharge.  Wet prep performed today shows *** consistent with ***.  Patient is interested in STI screening.   Plan: -Wet prep as above.  Will treat with***. -GC/chlamydia pending -Will check HIV and RPR  21, DO [facility]

## 2022-01-12 NOTE — Patient Instructions (Signed)
It was great seeing you today!  Today we checked for STIs, BV, yeast. I will call you if anything is abnormal or will send you a mychart message if normal  Feel free to call with any questions or concerns at any time, at [phone].   Take care,  Dr. Cora Collum [facility]

## 2022-01-13 LAB — HCV AB W REFLEX TO QUANT PCR: HCV Ab: NONREACTIVE

## 2022-01-13 LAB — HCV INTERPRETATION

## 2022-01-13 LAB — RPR: RPR Ser Ql: NONREACTIVE

## 2022-01-13 LAB — HIV ANTIBODY (ROUTINE TESTING W REFLEX): HIV Screen 4th Generation wRfx: NONREACTIVE

## 2022-01-17 LAB — CERVICOVAGINAL ANCILLARY ONLY
Bacterial Vaginitis (gardnerella): POSITIVE — AB
Candida Glabrata: NEGATIVE
Candida Vaginitis: POSITIVE — AB
Chlamydia: NEGATIVE
Comment: NEGATIVE
Comment: NEGATIVE
Comment: NEGATIVE
Comment: NEGATIVE
Comment: NEGATIVE
Comment: NORMAL
Neisseria Gonorrhea: NEGATIVE
Trichomonas: NEGATIVE

## 2022-01-18 ENCOUNTER — Other Ambulatory Visit: Payer: Self-pay | Admitting: Family Medicine

## 2022-01-18 MED ORDER — FLUCONAZOLE 150 MG PO TABS: 150.0000 mg | ORAL_TABLET | Freq: Once | ORAL | 0 refills | Status: DC

## 2022-01-18 MED ORDER — METRONIDAZOLE 500 MG PO TABS: 500.0000 mg | ORAL_TABLET | Freq: Two times a day (BID) | ORAL | 0 refills | Status: AC

## 2022-01-25 ENCOUNTER — Ambulatory Visit: Payer: Self-pay | Admitting: Family Medicine

## 2022-02-12 ENCOUNTER — Ambulatory Visit: Payer: Medicaid Other | Admitting: Family Medicine

## 2022-02-23 ENCOUNTER — Ambulatory Visit (INDEPENDENT_AMBULATORY_CARE_PROVIDER_SITE_OTHER): Payer: Medicaid Other | Admitting: Family Medicine

## 2022-02-23 ENCOUNTER — Encounter: Payer: Self-pay | Admitting: Family Medicine

## 2022-02-23 ENCOUNTER — Other Ambulatory Visit: Payer: Self-pay | Admitting: Family Medicine

## 2022-02-23 VITALS — BP 127/65 | HR 90 | Ht 61.0 in | Wt 217.4 lb

## 2022-02-23 DIAGNOSIS — Z3200 Encounter for pregnancy test, result unknown: Secondary | ICD-10-CM

## 2022-02-23 MED ORDER — VITAFOL GUMMIES 3.33-0.333-34.8 MG PO CHEW: 1.0000 | CHEWABLE_TABLET | Freq: Every day | ORAL | 0 refills | Status: DC

## 2022-02-23 MED ORDER — DOXYLAMINE-PYRIDOXINE 10-10 MG PO TBEC: 2.0000 | DELAYED_RELEASE_TABLET | Freq: Every day | ORAL | 0 refills | Status: DC

## 2022-02-23 NOTE — Progress Notes (Signed)
    SUBJECTIVE:   CHIEF COMPLAINT / HPI:   Possible Pregnancy  - urinary frequency, bloated, nauseous without vomiting - LMP 1/1 spotting for 1 week with some brown discharge - Was not trying to get pregnant but is okay with either being pregnant  - Had a faintly positive test at home - Hx of last son born at [redacted]w[redacted]d  PERTINENT  PMH / PSH: Reviewed  OBJECTIVE:   BP 127/65   Pulse 90   Ht 5\' 1"  (1.549 m)   Wt 217 lb 6.4 oz (98.6 kg)   LMP 01/16/2022   SpO2 100%   BMI 41.08 kg/m   General: NAD, well-appearing, well-nourished Respiratory: No respiratory distress, breathing comfortably, able to speak in full sentences Skin: warm and dry, no rashes noted on exposed skin Psych: Appropriate affect and mood  ASSESSMENT/PLAN:   Possible pregnancy Discussed with patient that based on the LMP she would currently be around [redacted]w[redacted]d. She was unable to urinate for the pregnancy test prior to the lab closing down for the day. Discussed with her that she could be a bit too early for reliable testing and recommended that she do a home test within the next week. Given how early she is, I would not recommend full clinic appointment for another month if the pregnancy test is positive. Discussed treating her body as if she was pregnant and avoiding alcohol and unsafe medications. She can use Diclegis and start prenatal vitamins at this time, both of which were sent in.     Rise Patience, [place]

## 2022-03-01 ENCOUNTER — Encounter (HOSPITAL_COMMUNITY): Payer: Self-pay | Admitting: Obstetrics and Gynecology

## 2022-03-01 ENCOUNTER — Inpatient Hospital Stay (HOSPITAL_COMMUNITY)
Admission: AD | Admit: 2022-03-01 | Discharge: 2022-03-01 | Disposition: A | Discharge status: Home / Self Care | Payer: Medicaid Other | Attending: Obstetrics and Gynecology | Admitting: Obstetrics and Gynecology

## 2022-03-01 ENCOUNTER — Other Ambulatory Visit: Payer: Self-pay

## 2022-03-01 DIAGNOSIS — R030 Elevated blood-pressure reading, without diagnosis of hypertension: Secondary | ICD-10-CM | POA: Insufficient documentation

## 2022-03-01 DIAGNOSIS — Z3202 Encounter for pregnancy test, result negative: Secondary | ICD-10-CM | POA: Insufficient documentation

## 2022-03-01 LAB — HCG, QUANTITATIVE, PREGNANCY: hCG, Beta Chain, Quant, S: <1 m[IU]/mL (ref <5)

## 2022-03-01 LAB — POCT PREGNANCY, URINE: Preg Test, Ur: NEGATIVE

## 2022-03-01 NOTE — MAU Note (Signed)
Theresa Conner is a 22 y.o. here in MAU reporting: took a pregnacy test at home last Monday and had a faint positive upt. Went to the doctor on Friday and they wanted her to take another pregnancy test next week. Has been feeling nauseated and tired. Having some cramping in her lower back and seeing some light bleeding.   LMP: 01/26/22  Onset of complaint: ongoing  Pain score: 0/10  Vitals:   03/01/22 0748  BP: (!) 150/87  Pulse: 84  Resp: 16  Temp: 98.6 F (37 C)  SpO2: 100%     FHT:NA  Lab orders placed from triage: upt

## 2022-03-01 NOTE — MAU Provider Note (Signed)
S Theresa Conner is a 22 y.o. G22P0101 female who presents to MAU today with complaint of positive home pregnancy test 2 weeks ago and light vaginal bleeding.   ROS: +VB +cramping  O BP (!) 150/87 (BP Location: Right Arm)   Pulse 84   Temp 98.6 F (37 C)   Resp 16   Ht 5\' 1"  (1.549 m)   Wt 97.7 kg   LMP 01/26/2022   SpO2 100% Comment: room air  BMI 40.70 kg/m  Physical Exam Vitals and nursing note reviewed.  Constitutional:      General: She is not in acute distress.    Appearance: Normal appearance.  HENT:     Head: Normocephalic and atraumatic.  Cardiovascular:     Rate and Rhythm: Normal rate.  Pulmonary:     Effort: Pulmonary effort is normal. No respiratory distress.  Musculoskeletal:        General: Normal range of motion.  Neurological:     General: No focal deficit present.     Mental Status: She is alert and oriented to person, place, and time.  Psychiatric:        Mood and Affect: Mood normal.        Behavior: Behavior normal.    Results for orders placed or performed during the hospital encounter of 03/01/22 (from the past 24 hour(s))  Pregnancy, urine POC     Status: None   Collection Time: 03/01/22  7:38 AM  Result Value Ref Range   Preg Test, Ur NEGATIVE NEGATIVE  hCG, quantitative, pregnancy     Status: None   Collection Time: 03/01/22  8:01 AM  Result Value Ref Range   hCG, Beta Chain, Quant, S <1 <5 mIU/mL    MDM: Labs ordered and reviewed. Bleeding is likely the start of menstrual cycle. No signs of pregnancy. Pt reports she did not want to be pregnant, recommend follow up with OBGYN for contraception. Stable for discharge home.   A 1. Negative pregnancy test   2. Elevated blood pressure reading     P Discharge home Follow up at [facility] Return to MAU for OB emergencies  Allergies as of 03/01/2022       Reactions   Other Anaphylaxis, Hives   Ranch Dressing         Medication List     TAKE these medications     Doxylamine-Pyridoxine 10-10 MG Tbec TAKE 2 TABLETS BY MOUTH DAILY   ibuprofen 800 MG tablet Commonly known as: ADVIL Take 1 tablet (800 mg total) by mouth every 8 (eight) hours as needed.   ketoconazole 2 % cream Commonly known as: NIZORAL Apply 1 application topically daily.   Vitafol Gummies 3.33-0.333-34.8 MG Chew Chew 1 tablet by mouth daily.        Julianne Handler, CNM 03/01/2022 9:08 AM

## 2022-04-13 ENCOUNTER — Ambulatory Visit: Payer: Medicaid Other | Admitting: Family Medicine

## 2022-04-20 ENCOUNTER — Other Ambulatory Visit: Payer: Self-pay | Admitting: Family Medicine

## 2022-04-20 ENCOUNTER — Encounter: Payer: Self-pay | Admitting: Family Medicine

## 2022-05-03 ENCOUNTER — Other Ambulatory Visit (HOSPITAL_COMMUNITY)
Admission: RE | Admit: 2022-05-03 | Discharge: 2022-05-03 | Disposition: A | Discharge status: Home / Self Care | Payer: Medicaid Other | Source: Ambulatory Visit | Attending: Family Medicine | Admitting: Family Medicine

## 2022-05-03 ENCOUNTER — Ambulatory Visit: Payer: Medicaid Other | Admitting: Family Medicine

## 2022-05-03 ENCOUNTER — Encounter: Payer: Self-pay | Admitting: Family Medicine

## 2022-05-03 ENCOUNTER — Other Ambulatory Visit: Payer: Self-pay

## 2022-05-03 VITALS — BP 146/106 | HR 100 | Ht 61.0 in | Wt 228.0 lb

## 2022-05-03 DIAGNOSIS — Z124 Encounter for screening for malignant neoplasm of cervix: Secondary | ICD-10-CM

## 2022-05-03 DIAGNOSIS — N898 Other specified noninflammatory disorders of vagina: Secondary | ICD-10-CM | POA: Diagnosis not present

## 2022-05-03 DIAGNOSIS — N926 Irregular menstruation, unspecified: Secondary | ICD-10-CM

## 2022-05-03 LAB — POCT WET PREP (WET MOUNT)
Clue Cells Wet Prep Whiff POC: NEGATIVE
Trichomonas Wet Prep HPF POC: ABSENT

## 2022-05-03 LAB — POCT URINALYSIS DIP (MANUAL ENTRY)
Bilirubin, UA: NEGATIVE
Glucose, UA: NEGATIVE mg/dL
Nitrite, UA: NEGATIVE
Spec Grav, UA: 1.025 (ref 1.010–1.025)
Urobilinogen, UA: 0.2 E.U./dL
pH, UA: 5.5 (ref 5.0–8.0)

## 2022-05-03 LAB — POCT URINE PREGNANCY: Preg Test, Ur: NEGATIVE

## 2022-05-03 LAB — POCT UA - MICROSCOPIC ONLY: RBC, Urine, Miroscopic: >20 (ref 0–2)

## 2022-05-03 MED ORDER — NORGESTIMATE-ETH ESTRADIOL 0.25-35 MG-MCG PO TABS: 1.0000 | ORAL_TABLET | Freq: Every day | ORAL | 11 refills | Status: DC

## 2022-05-03 NOTE — Patient Instructions (Signed)
It was great seeing you today!  You came in for pregnancy testing and we did your pap smear and STI testing. Ill call with any abnormal results.   Your urine pregnancy test was negative.   Feel free to call with any questions or concerns at any time, at [phone].   Take care,  Dr. Cora Collum [facility]

## 2022-05-03 NOTE — Progress Notes (Signed)
    SUBJECTIVE:   CHIEF COMPLAINT / HPI:   Vaginal Discharge: Patient is a 22 y.o. female presenting with more vaginal discharge for the past month.  She states the discharge is of a slimy consistency.  She is interested in screening for sexually transmitted infections today and her pap smear   LMP 3/5. Sexually active but thought partner used condom.  Not on contraception  Has increased fatigue, breast tenderness, nausea without vomiting. Increased appetite. Requests blood pregnancy test.   PERTINENT  PMH / PSH: Reviewed   OBJECTIVE:   BP (!) 146/106   Pulse 100   Ht 5\' 1"  (1.549 m)   Wt 228 lb (103.4 kg)   LMP 03/27/2022 (Exact Date)   SpO2 100%   BMI 43.08 kg/m    General: NAD, pleasant, able to participate in exam Respiratory: Normal effort, no obvious respiratory distress Pelvic: VULVA: normal appearing vulva with no masses, tenderness or lesions, VAGINA: Normal appearing vagina with normal color, no lesions, with scant discharge and moderate amount of blood present, CERVIX: No lesions, scant and thin discharge and moderate blood present  Chaperone Dayshia Ottley present for pelvic exam  ASSESSMENT/PLAN:   No problem-specific Assessment & Plan notes found for this encounter.   Assessment:  22 y.o. female with vaginal discharge for the past month, unprotected sex, and late menstrual cycle  Physical exam significant for thin discharge and moderate amount of bleeding.  Urine pregnancy test negative. Wet prep performed today which was unremarkable.  Patient is interested in STI screening.   Plan - pap smear performed  -GC/chlamydia pending -Will check HIV and RPR - Hcg quant  - sent in OCPs for contraception   Cora Collum, DO [facility]

## 2022-05-04 LAB — BETA HCG QUANT (REF LAB): hCG Quant: <1 m[IU]/mL

## 2022-05-05 LAB — URINE CULTURE

## 2022-05-08 LAB — T PALLIDUM ANTIBODY, EIA: T pallidum Antibody, EIA: NEGATIVE

## 2022-05-08 LAB — RPR W/REFLEX TO TREPSURE: RPR: NONREACTIVE

## 2022-05-08 LAB — HIV ANTIBODY (ROUTINE TESTING W REFLEX): HIV Screen 4th Generation wRfx: NONREACTIVE

## 2022-05-09 LAB — CYTOLOGY - PAP
Chlamydia: NEGATIVE
Comment: NEGATIVE
Comment: NEGATIVE
Comment: NEGATIVE
Comment: NORMAL
Diagnosis: UNDETERMINED — AB
High risk HPV: NEGATIVE
Neisseria Gonorrhea: NEGATIVE
Trichomonas: NEGATIVE

## 2022-05-17 ENCOUNTER — Ambulatory Visit: Payer: Medicaid Other | Admitting: Family Medicine
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
## 2022-10-25 ENCOUNTER — Encounter: Payer: Self-pay | Admitting: Family Medicine

## 2022-10-25 ENCOUNTER — Ambulatory Visit (INDEPENDENT_AMBULATORY_CARE_PROVIDER_SITE_OTHER): Payer: Medicaid Other | Admitting: Family Medicine

## 2022-10-25 ENCOUNTER — Ambulatory Visit: Payer: Medicaid Other | Admitting: Family Medicine

## 2022-10-25 ENCOUNTER — Other Ambulatory Visit: Payer: Self-pay | Admitting: Family Medicine

## 2022-10-25 VITALS — BP 151/93 | HR 77 | Ht 61.0 in | Wt 218.2 lb

## 2022-10-25 DIAGNOSIS — N926 Irregular menstruation, unspecified: Secondary | ICD-10-CM | POA: Diagnosis not present

## 2022-10-25 DIAGNOSIS — I10 Essential (primary) hypertension: Secondary | ICD-10-CM | POA: Diagnosis not present

## 2022-10-25 LAB — POCT URINE PREGNANCY: Preg Test, Ur: NEGATIVE

## 2022-10-25 MED ORDER — AMLODIPINE BESYLATE 5 MG PO TABS: 5.0000 mg | ORAL_TABLET | Freq: Every day | ORAL | 0 refills | Status: DC

## 2022-10-25 NOTE — Assessment & Plan Note (Signed)
Patient has had multiple elevated blood pressure readings.  Today her blood pressure ranged from 140s to 150s/90s.  Discussed that there would be important to start her on some blood pressure medication at this time to help improve her numbers. - Amlodipine 5 mg daily - Advised patient to follow-up with her PCP in a month to reevaluate her blood pressure and adjust the dose or get refills as needed

## 2022-10-25 NOTE — Patient Instructions (Addendum)
It was great to see you today! Thank you for choosing [facility] for your primary care. Theresa Conner was seen for concern for missed period.  Today we addressed: Urine pregnancy test negative. Will confirm with blood test High BP readings - start taking amlodipine 5 mg daily and follow up in one month to recheck medicine and BP Consider contraceptive options for follow up if you are interested later  If you haven't already, sign up for My Chart to have easy access to your labs results, and communication with your primary care physician.  You should return to our clinic Return in about 4 weeks (around 11/22/2022). Please arrive 15 minutes before your appointment to ensure smooth check in process.  We appreciate your efforts in making this happen.  Thank you for allowing me to participate in your care, Fortunato Curling, DO 10/25/2022, 12:01 PM PGY-1, [facility]

## 2022-10-25 NOTE — Assessment & Plan Note (Deleted)
LMP 09/07/22. Concerned that she may be pregnant. Took a home pregnancy test but was unsure if it was positive. - Negative pregnancy test today but would like to confirm with blood test - Discussed with patient contraceptive options however patient denied any contraceptive method at this time.

## 2022-10-25 NOTE — Progress Notes (Signed)
    SUBJECTIVE:   CHIEF COMPLAINT / HPI:   Missed Period Patient comes in today to get a pregnancy test since her LMP was 09/07/22. She has never taken her Sprintec. Last time she had sexual 9/2 unprotected and her partner ejaculated into her. She thought that her period was starting on 9/26 because she noticed some light brown, light reddish/pink blood, along with discharge. This lasted for three days, without getting heavier. This is not like her normal periods. She reports taking two pregnancy tests at home demonstrating two faint double lines.   She reports being tired, nauseous, and peeing more often. She is unsure whether she would like to follow through with potential pregnancy or or not. She has discussed this with her partner and is unsure if they would like an unplanned pregnancy especially with her having another son right now and is happy with her life right now.   What she originally came in for was breast tenderness, but she went to an urgent care and it has resolved since.   Elevated BP History of HTN during pregnancy and has multiple office visits with elevated BPs. Recheck was elevated today.  PERTINENT  PMH / PSH: STI, UTI  OBJECTIVE:   BP (!) 151/93   Pulse 77   Ht 5\' 1"  (1.549 m)   Wt 218 lb 3.2 oz (99 kg)   LMP 09/07/2022   SpO2 100%   BMI 41.23 kg/m   General: Awake and Alert in NAD HEENT: Normocephalic, atraumatic. Conjunctiva normal. No nasal discharge Cardiovascular: RRR. No M/R/G Respiratory: CTAB, normal WOB on RA. No wheezing, crackles, rhonchi, or diminished breath sounds. Abdomen: Soft, non-tender, non-distended. Bowel sounds normoactive Extremities: No BLE edema, no deformities or significant joint findings. Skin: Warm and dry. Neuro: A&Ox3. No focal neurological deficits.  ASSESSMENT/PLAN:   Primary hypertension Patient has had multiple elevated blood pressure readings.  Today her blood pressure ranged from 140s to 150s/90s.  Discussed that there  would be important to start her on some blood pressure medication at this time to help improve her numbers. - Amlodipine 5 mg daily - Advised patient to follow-up with her PCP in a month to reevaluate her blood pressure and adjust the dose or get refills as needed  Missed period LMP 09/07/22. Concerned that she may be pregnant. Took a home pregnancy test but was unsure if it was positive.  Her urine pregnancy test was negative today, we discussed that the sensitivity and specificity of urine pregnancy tests are very high. However, she reports that in the past she had a discrepancy between her urine and blood test and would like to confirm with a blood test today.  - Beta HCG  - Discussed with patient contraceptive options however patient denied any contraceptive method at this time.   Fortunato Curling, DO [facility]

## 2022-10-25 NOTE — Assessment & Plan Note (Signed)
LMP 09/07/22. Concerned that she may be pregnant. Took a home pregnancy test but was unsure if it was positive.  Her urine pregnancy test was negative today, we discussed that the sensitivity and specificity of urine pregnancy tests are very high. However, she reports that in the past she had a discrepancy between her urine and blood test and would like to confirm with a blood test today.  - Beta HCG  - Discussed with patient contraceptive options however patient denied any contraceptive method at this time.

## 2022-10-26 LAB — BETA HCG QUANT (REF LAB): hCG Quant: <1 m[IU]/mL

## 2022-11-08 ENCOUNTER — Ambulatory Visit: Payer: Medicaid Other

## 2022-11-08 NOTE — Progress Notes (Deleted)
  SUBJECTIVE:   CHIEF COMPLAINT / HPI:   STI check - recently became sexually active with a new partner, wants to be checked for STIs. Previously tested for ***.  - preferred gender of partner: *** - Medications tried: *** - Sexually active with *** *** partner(s) - Last sexual encounter: *** - Contraception: *** Symptoms include: {STISXs:28021}  PERTINENT  PMH / PSH: HTN  OBJECTIVE:  LMP 09/07/2022  Physical Exam   ASSESSMENT/PLAN:  There are no diagnoses linked to this encounter. No follow-ups on file. Theresa Mattocks, DO 11/08/2022, 11:54 AM PGY-***, [facility] {    This will disappear when note is signed, click to select method of visit    :1}

## 2022-11-09 ENCOUNTER — Ambulatory Visit: Payer: Medicaid Other | Admitting: Family Medicine

## 2022-11-19 ENCOUNTER — Ambulatory Visit: Payer: Medicaid Other | Admitting: Family Medicine

## 2022-11-19 NOTE — Progress Notes (Deleted)
    SUBJECTIVE:   CHIEF COMPLAINT / HPI: check up/STD screening and blood work  ***  PERTINENT  PMH / PSH: STI, UTI  OBJECTIVE:   LMP 09/07/2022   General: Awake and Alert in NAD HEENT: Normocephalic, atraumatic. Conjunctiva normal. No nasal discharge Cardiovascular: RRR. No M/R/G Respiratory: CTAB, normal WOB on RA. No wheezing, crackles, rhonchi, or diminished breath sounds. Abdomen: Soft, non-tender, non-distended. Bowel sounds normoactive/hypoactive/hyperactive. *** Extremities: No BLE edema, no deformities or significant joint findings. Skin: Warm and dry. Neuro: A&Ox***. No focal neurological deficits.  ASSESSMENT/PLAN:   No problem-specific Assessment & Plan notes found for this encounter.     Fortunato Curling, DO [facility]

## 2022-11-20 ENCOUNTER — Ambulatory Visit: Payer: Medicaid Other | Admitting: Family Medicine

## 2022-11-30 ENCOUNTER — Ambulatory Visit (INDEPENDENT_AMBULATORY_CARE_PROVIDER_SITE_OTHER): Payer: Medicaid Other | Admitting: Family Medicine

## 2022-11-30 ENCOUNTER — Encounter: Payer: Self-pay | Admitting: Family Medicine

## 2022-11-30 ENCOUNTER — Other Ambulatory Visit (HOSPITAL_COMMUNITY)
Admission: RE | Admit: 2022-11-30 | Discharge: 2022-11-30 | Disposition: A | Discharge status: Home / Self Care | Payer: Medicaid Other | Source: Ambulatory Visit | Attending: Family Medicine | Admitting: Family Medicine

## 2022-11-30 VITALS — BP 140/117 | HR 86 | Ht 61.0 in | Wt 217.0 lb

## 2022-11-30 DIAGNOSIS — A599 Trichomoniasis, unspecified: Secondary | ICD-10-CM | POA: Insufficient documentation

## 2022-11-30 DIAGNOSIS — I10 Essential (primary) hypertension: Secondary | ICD-10-CM | POA: Diagnosis not present

## 2022-11-30 DIAGNOSIS — A5901 Trichomonal vulvovaginitis: Secondary | ICD-10-CM

## 2022-11-30 DIAGNOSIS — R300 Dysuria: Secondary | ICD-10-CM | POA: Diagnosis not present

## 2022-11-30 DIAGNOSIS — Z202 Contact with and (suspected) exposure to infections with a predominantly sexual mode of transmission: Secondary | ICD-10-CM | POA: Insufficient documentation

## 2022-11-30 LAB — POCT WET PREP (WET MOUNT)
Clue Cells Wet Prep Whiff POC: POSITIVE
WBC, Wet Prep HPF POC: >20

## 2022-11-30 LAB — POCT URINE DIPSTICK
Bilirubin, UA: NEGATIVE
Glucose, UA: NEGATIVE mg/dL
Ketones, POC UA: NEGATIVE mg/dL
Nitrite, UA: NEGATIVE
POC PROTEIN,UA: NEGATIVE
Spec Grav, UA: 1.025 (ref 1.010–1.025)
Urobilinogen, UA: 1 U/dL
pH, UA: 6.5 (ref 5.0–8.0)

## 2022-11-30 LAB — POCT UA - MICROSCOPIC ONLY: Trichomonas, UA: POSITIVE

## 2022-11-30 LAB — POCT URINE PREGNANCY: Preg Test, Ur: NEGATIVE

## 2022-11-30 MED ORDER — METRONIDAZOLE 500 MG PO TABS: 500.0000 mg | ORAL_TABLET | Freq: Two times a day (BID) | ORAL | 0 refills | Status: DC

## 2022-11-30 NOTE — Assessment & Plan Note (Addendum)
Had unprotected sex about two months ago. Has been having some smelly discharge and pain in her back and abdomen along with some increased urination. Abdominal exam not concerning for PID. No cervical motion tenderness. Cervix non friable on exam.  - Wet prep - GC & Trich - Urine dipstick - Urine hcg  - Patient did not have time for blood work, scheduled appointment for ATC on Monday 11/11 for blood work (HIV, Hep B, Hep C, RPR)

## 2022-11-30 NOTE — Progress Notes (Addendum)
    SUBJECTIVE:   CHIEF COMPLAINT / HPI:  - STD testing.  Patient had unprotected sex at the end of September and 1 week after sex had cottage cheeselike discharge.  She had 2 metronidazole pills left over and took those.  Had resolution of discharge after that.  She had her period last month (end of October) which lasted 2 to 3 days and she only had pinkish discharge and it was not as heavy as her normal period.  She says partner told her he had not had sex in 2 years and that there was no chance that she could have infection from him.  For the past few days has had cramping in her back and stomach bilaterally and clear discharge that is smelly.  She also has some redness around her vulva and it hurts when she wipes.  She thinks she may be peeing more than normal but does not usually have any burning while peeing.  She is not sure that she has ever had a fever but she did feel hot at work 2 days ago and had to sit down.  She denies any difficulty breathing, chest pain, headaches, changes in vision.  PERTINENT  PMH / PSH:  - HTN  OBJECTIVE:   BP (!) 140/117   Pulse 86   Ht 5\' 1"  (1.549 m)   Wt 217 lb (98.4 kg)   SpO2 99%   BMI 41.00 kg/m   General: A&O, NAD HEENT: No sign of trauma, EOM grossly intact Cardiac: RRR, no m/r/g Respiratory: CTAB, normal WOB, no w/c/r GI: Soft, NTTP, non-distended, no rebound or guarding, no CVA tenderness bilaterally  Extremities: NTTP, no peripheral edema. Neuro: Normal gait, moves all four extremities appropriately.  GU exam completed with chaperone, Maree Erie, CMA Female genitalia: Vulva: vulvar erythema without obvious excoriations or lesions Vagina: scant white discharge at posterior vaginal wall Cervix: cervical discharge present - white and scant, no cervical wall motion tenderness, cervix non erythematous, non friable  Psych: Appropriate mood and affect  ASSESSMENT/PLAN:   Trichomonas infection Patient positive for trichomonas on wet  prep today. Informed patient that partner will also need to be treated. - Metronidazole 500 mg BID for 7 days   Possible exposure to STD Had unprotected sex about two months ago. Has been having some smelly discharge and pain in her back and abdomen along with some increased urination. Abdominal exam not concerning for PID. No cervical motion tenderness. Cervix non friable on exam.  - Wet prep - GC & Trich - Urine dipstick - Urine hcg  - Patient did not have time for blood work, scheduled appointment for ATC on Monday 11/11 for blood work (HIV, Hep B, Hep C, RPR)  Primary hypertension Blood pressure reading high today 140/117. Patient had to leave d/t being late for moms appointment. Unable to contact patient via phone. MyChart message sent to patient requesting she return later today for BP check. Patient denied any headache, chest pain, changes in vision.  - continue amlodipine 5 mg - recheck BP at next visit     Hal Morales, MD [facility]

## 2022-11-30 NOTE — Progress Notes (Signed)
Urine pregnancy test negative

## 2022-11-30 NOTE — Assessment & Plan Note (Signed)
Blood pressure reading high today 140/117. Patient had to leave d/t being late for moms appointment. Unable to contact patient via phone. MyChart message sent to patient requesting she return later today for BP check. Patient denied any headache, chest pain, changes in vision.  - continue amlodipine 5 mg - recheck BP at next visit

## 2022-11-30 NOTE — Patient Instructions (Signed)
It was wonderful to see you today.  Please bring ALL of your medications with you to every visit.   Today we talked about:  STD testing. You were positive for trichomonas. Please take your antibiotics for 7 days.  I will call or send you a mychart message with the rest of the results.   Thank you for choosing [facility].   Please call [phone] with any questions about today's appointment.  Please arrive at least 15 minutes prior to your scheduled appointments.   If you had blood work today, I will send you a MyChart message or a letter if results are normal. Otherwise, I will give you a call.   If you had a referral placed, they will call you to set up an appointment. Please give [place] a call if you don't hear back in the next 2 weeks.   If you need additional refills before your next appointment, please call your pharmacy first.   Hal Morales, MD Family Medicine

## 2022-11-30 NOTE — Assessment & Plan Note (Signed)
Patient positive for trichomonas on wet prep today. Informed patient that partner will also need to be treated. - Metronidazole 500 mg BID for 7 days

## 2022-12-03 ENCOUNTER — Ambulatory Visit: Payer: Self-pay

## 2022-12-03 ENCOUNTER — Ambulatory Visit: Payer: Medicaid Other | Admitting: Family Medicine

## 2022-12-03 ENCOUNTER — Other Ambulatory Visit: Payer: Self-pay

## 2022-12-03 LAB — CERVICOVAGINAL ANCILLARY ONLY
Chlamydia: NEGATIVE
Comment: NEGATIVE
Comment: NORMAL
Neisseria Gonorrhea: NEGATIVE

## 2022-12-04 ENCOUNTER — Encounter: Payer: Self-pay | Admitting: Family Medicine

## 2022-12-04 LAB — URINE CULTURE

## 2022-12-05 ENCOUNTER — Other Ambulatory Visit: Payer: Self-pay | Admitting: Family Medicine

## 2022-12-05 DIAGNOSIS — N3000 Acute cystitis without hematuria: Secondary | ICD-10-CM

## 2022-12-05 MED ORDER — NITROFURANTOIN MONOHYD MACRO 100 MG PO CAPS: 100.0000 mg | ORAL_CAPSULE | Freq: Two times a day (BID) | ORAL | 0 refills | Status: AC

## 2022-12-05 NOTE — Progress Notes (Signed)
Patient urine culture positive for Staph. Patient notified and antibiotic sent to pharmacy

## 2023-01-30 ENCOUNTER — Emergency Department (HOSPITAL_BASED_OUTPATIENT_CLINIC_OR_DEPARTMENT_OTHER)
Admission: EM | Admit: 2023-01-30 | Discharge: 2023-01-30 | Disposition: A | Discharge status: Home / Self Care | Payer: Medicaid Other | Attending: Emergency Medicine | Admitting: Emergency Medicine

## 2023-01-30 ENCOUNTER — Encounter (HOSPITAL_BASED_OUTPATIENT_CLINIC_OR_DEPARTMENT_OTHER): Payer: Self-pay | Admitting: Emergency Medicine

## 2023-01-30 ENCOUNTER — Other Ambulatory Visit: Payer: Self-pay

## 2023-01-30 ENCOUNTER — Emergency Department (HOSPITAL_BASED_OUTPATIENT_CLINIC_OR_DEPARTMENT_OTHER): Payer: Medicaid Other

## 2023-01-30 DIAGNOSIS — J40 Bronchitis, not specified as acute or chronic: Secondary | ICD-10-CM | POA: Diagnosis not present

## 2023-01-30 DIAGNOSIS — Z79899 Other long term (current) drug therapy: Secondary | ICD-10-CM | POA: Diagnosis not present

## 2023-01-30 DIAGNOSIS — Z20822 Contact with and (suspected) exposure to covid-19: Secondary | ICD-10-CM | POA: Insufficient documentation

## 2023-01-30 DIAGNOSIS — R059 Cough, unspecified: Secondary | ICD-10-CM | POA: Diagnosis not present

## 2023-01-30 HISTORY — DX: Essential (primary) hypertension: I10

## 2023-01-30 LAB — RESP PANEL BY RT-PCR (RSV, FLU A&B, COVID)  RVPGX2
Influenza A by PCR: NEGATIVE
Influenza B by PCR: NEGATIVE
Resp Syncytial Virus by PCR: NEGATIVE
SARS Coronavirus 2 by RT PCR: NEGATIVE

## 2023-01-30 MED ORDER — BENZONATATE 100 MG PO CAPS
100.0000 mg | ORAL_CAPSULE | Freq: Once | ORAL | Status: AC
  Administered 2023-01-30: 100 mg via ORAL
  Filled 2023-01-30: qty 1

## 2023-01-30 MED ORDER — BENZONATATE 100 MG PO CAPS: 100.0000 mg | ORAL_CAPSULE | Freq: Three times a day (TID) | ORAL | 0 refills | Status: DC

## 2023-01-30 NOTE — ED Triage Notes (Signed)
 Persistent cough , had URI 3 weeks ago , feels chills , chest pain every time she coughs she said .

## 2023-01-30 NOTE — ED Provider Notes (Signed)
 [place] [facility] Provider Note   CSN: [id number] Arrival date & time: 01/30/23  1716     History  Chief Complaint  Patient presents with   Cough    Theresa Conner is a 23 y.o. female no pertinent past medical history presented for persistent cough for the past 3 weeks.  Patient states she initially had a cold but since then has had a cough where she is coughing up yellow sputum.  Patient denies fevers or inability to eat or drink and states that overall she is doing well and just has persistent cough.  Patient denies chest pain or shortness of breath.  Home Medications Prior to Admission medications   Medication Sig Start Date End Date Taking? Authorizing Provider  benzonatate  (TESSALON ) 100 MG capsule Take 1 capsule (100 mg total) by mouth every 8 (eight) hours. 01/30/23  Yes Odeth Bry, Lynwood DASEN, PA-C  amLODipine  (NORVASC ) 5 MG tablet TAKE 1 TABLET(5 MG) BY MOUTH AT BEDTIME 10/26/22   Nicholas Bar, MD  metroNIDAZOLE  (FLAGYL ) 500 MG tablet Take 1 tablet (500 mg total) by mouth 2 (two) times daily. 11/30/22   Lonnie Mahnoor, MD      Allergies    Other    Review of Systems   Review of Systems  Respiratory:  Positive for cough.     Physical Exam Updated Vital Signs BP (!) 136/92   Pulse 77   Temp 98.3 F (36.8 C) (Oral)   Resp 16   Wt 98.4 kg   LMP 01/13/2023   SpO2 100%   BMI 41.00 kg/m  Physical Exam Vitals reviewed.  Constitutional:      General: She is not in acute distress. HENT:     Head: Normocephalic and atraumatic.  Eyes:     Extraocular Movements: Extraocular movements intact.     Conjunctiva/sclera: Conjunctivae normal.     Pupils: Pupils are equal, round, and reactive to light.  Cardiovascular:     Rate and Rhythm: Normal rate and regular rhythm.     Pulses: Normal pulses.     Heart sounds: Normal heart sounds.     Comments: 2+ bilateral radial/dorsalis pedis pulses with regular rate Pulmonary:     Effort:  Pulmonary effort is normal. No respiratory distress.     Breath sounds: Normal breath sounds.  Abdominal:     Palpations: Abdomen is soft.     Tenderness: There is no abdominal tenderness. There is no guarding or rebound.  Musculoskeletal:        General: Normal range of motion.     Cervical back: Normal range of motion and neck supple.     Comments: 5 out of 5 bilateral grip/leg extension strength  Skin:    General: Skin is warm and dry.     Capillary Refill: Capillary refill takes less than 2 seconds.  Neurological:     General: No focal deficit present.     Mental Status: She is alert and oriented to person, place, and time.     Comments: Sensation intact in all 4 limbs  Psychiatric:        Mood and Affect: Mood normal.     ED Results / Procedures / Treatments   Labs (all labs ordered are listed, but only abnormal results are displayed) Labs Reviewed  RESP PANEL BY RT-PCR (RSV, FLU A&B, COVID)  [id number]    EKG None  Radiology DG Chest 2 View Result Date: 01/30/2023 CLINICAL DATA:  Cough EXAM: CHEST -  2 VIEW COMPARISON:  None Available. FINDINGS: The heart size and mediastinal contours are within normal limits. Both lungs are clear. The visualized skeletal structures are unremarkable. IMPRESSION: No active cardiopulmonary disease. Electronically Signed   By: Greig Pique M.D.   On: 01/30/2023 19:28    Procedures Procedures    Medications Ordered in ED Medications  benzonatate  (TESSALON ) capsule 100 mg (100 mg Oral Given 01/30/23 2125)    ED Course/ Medical Decision Making/ A&P                                 Medical Decision Making Amount and/or Complexity of Data Reviewed Radiology: ordered.  Risk Prescription drug management.   Analayah S Ainley 23 y.o. presented today for URI like symptoms. Working DDx that I considered at this time includes, but not limited to, bronchitis, viral illness, pharyngitis, mono, sinusitis, electrolyte abnormality, AOM,  pneumonia.  R/o DDx: viral illness, pharyngitis, mono, sinusitis, electrolyte abnormality, AOM, pneumonia: these diagnoses are not consistent with patient's history, presentation, physical exam, labs/imaging findings.  Review of prior external notes: 03/01/2022 discharge  Unique Tests and My Interpretation:  Respiratory Panel: Negative Chest x-ray negative  Social Determinants of Health: none  Discussion with Independent Historian: None  Discussion of Management of Tests: None  Risk: Medium: prescription drug management  Risk Stratification Score: None  Plan: On exam patient was no acute distress stable vitals.  Patient is exam is ultimately unremarkable.  Patient's swab and chest x-ray from triage are negative and patient symptoms are consistent with bronchitis.  Will give Tessalon  here and prescribe this patient this and have patient follow-up with her primary care provider.  Encouraged her to use Tylenol  every 6 as needed for pain remain hydrated and eat food as tolerated.  Not requiring any oxygen and has not required any oxygen at any point during the ED stay.  Patient was given return precautions.patient stable for discharge at this time.  Patient verbalized understanding of plan.  This chart was dictated using voice recognition software.  Despite best efforts to proofread,  errors can occur which can change the documentation meaning.         Final Clinical Impression(s) / ED Diagnoses Final diagnoses:  Bronchitis    Rx / DC Orders ED Discharge Orders          Ordered    benzonatate  (TESSALON ) 100 MG capsule  Every 8 hours        01/30/23 2111              Victor Lynwood ONEIDA DEVONNA 01/30/23 2221    Freddi Hamilton, MD 01/30/23 2321

## 2023-01-30 NOTE — Discharge Instructions (Signed)
 Please follow-up with your primary care provider in regards with symptoms and ER visit.  Today your exam is consistent with bronchitis you are given 1 cough medicine here however I have prescribed the rest.  Please use honey on Tylenol  every 6 hours needed for pain remain hydrated follow-up with your primary care provider.  If symptoms change or worsen please return to the ER.

## 2023-02-14 NOTE — Progress Notes (Deleted)
    SUBJECTIVE:   CHIEF COMPLAINT / HPI: STD check  ***  PERTINENT  PMH / PSH: HTN  OBJECTIVE:   LMP 01/13/2023   General: Awake and Alert in NAD HEENT: NCAT. Sclera anicteric. No rhinorrhea. No oropharyngeal erythema. Cardiovascular: RRR. No M/R/G Respiratory: CTAB, normal WOB on RA. No wheezing, crackles, rhonchi, or diminished breath sounds. Abdomen: Soft, non-tender, non-distended. Bowel sounds normoactive/hypoactive/hyperactive. *** Extremities: Able to move all extremities equally. No BLE edema, no deformities or significant joint findings. Skin: Warm and dry. No abrasions or rashes noted. Neuro: A&Ox***. No focal neurological deficits. F GU: Normally developed genitalia with no external lesions or eruptions. Vagina and cervix show no lesions, inflammation, discharge or tenderness. No cystocele. Bimanual exam reveals normal sized uterus no masses appreciated, no pain with examination.***. Chaperoned by CMA ***.   ASSESSMENT/PLAN:   No problem-specific Assessment & Plan notes found for this encounter.     Fortunato Curling, DO [facility]

## 2023-02-15 ENCOUNTER — Ambulatory Visit: Payer: Medicaid Other | Admitting: Family Medicine

## 2023-02-15 DIAGNOSIS — Z113 Encounter for screening for infections with a predominantly sexual mode of transmission: Secondary | ICD-10-CM

## 2023-02-18 ENCOUNTER — Ambulatory Visit: Payer: Medicaid Other

## 2023-02-18 NOTE — Progress Notes (Deleted)
  SUBJECTIVE:   CHIEF COMPLAINT / HPI:   STI check Last encounter on 11/30/2022 patient was positive for trichomonas.    PERTINENT  PMH / PSH: HTN  Past Medical History:  Diagnosis Date   Hypertension    STI (sexually transmitted infection)    UTI (urinary tract infection)    OBJECTIVE:  LMP 01/13/2023  Physical Exam   ASSESSMENT/PLAN:   Assessment & Plan  No follow-ups on file. Shelby Mattocks, DO 02/18/2023, 1:45 PM PGY-***, [facility] {    This will disappear when note is signed, click to select method of visit    :1}

## 2023-02-25 ENCOUNTER — Ambulatory Visit: Payer: Medicaid Other | Admitting: Family Medicine

## 2023-02-25 ENCOUNTER — Encounter: Payer: Self-pay | Admitting: Family Medicine

## 2023-02-25 VITALS — BP 134/87 | HR 90 | Ht 61.0 in | Wt 219.1 lb

## 2023-02-25 DIAGNOSIS — N926 Irregular menstruation, unspecified: Secondary | ICD-10-CM

## 2023-02-25 DIAGNOSIS — Z113 Encounter for screening for infections with a predominantly sexual mode of transmission: Secondary | ICD-10-CM | POA: Diagnosis not present

## 2023-02-25 DIAGNOSIS — I10 Essential (primary) hypertension: Secondary | ICD-10-CM

## 2023-02-25 LAB — POCT URINE PREGNANCY: Preg Test, Ur: NEGATIVE

## 2023-02-25 MED ORDER — LABETALOL HCL 100 MG PO TABS: 100.0000 mg | ORAL_TABLET | Freq: Two times a day (BID) | ORAL | 0 refills | Status: AC

## 2023-02-25 NOTE — Assessment & Plan Note (Addendum)
Uncontrolled, first blood pressure reading today 144/106, repeat 136/92.  Patient states that she does not take anything for blood pressure.  Reports intermittent headaches but thinks that this may be due to pregnancy. Amlodipine listed in chart however pt states she is not taking.  -Labetalol 100 mg twice daily - Advised patient that she will need an individual appointment to address her blood pressure in 1 to 2 weeks - ED precautions discussed

## 2023-02-25 NOTE — Progress Notes (Cosign Needed)
    SUBJECTIVE:   CHIEF COMPLAINT / HPI:   Presenting for pregnancy test - Concerned she is pregnant, last menstrual period started 01/22/2023, lasted 3 to 4 days, stopped 1 day, then restarted for about 6 days - Patient was not trying to get pregnant - Had a test at home with a faint positive line - Reports she has had spotting every time she wipes since the end of her last period - Reports bilateral breast pain x 1 month, headaches, fatigue - G1P0101 - last pregnancy delivered at [redacted]w[redacted]d by cesarean due to breech presentation, denies complications with her last pregnancy but notes some hypertension towards the end  Reports that she does not take any medications for hypertension  PERTINENT  PMH / PSH: HTN, GERD, trichomonas  OBJECTIVE:   BP 134/87   Pulse 90   Ht 5\' 1"  (1.549 m)   Wt 219 lb 2 oz (99.4 kg)   LMP 01/27/2023   SpO2 100%   BMI 41.40 kg/m   Breast exam: Jon Gills, CMA present as chaperone. No masses or lumps felt to bilateral breasts. Mild TTP to bilateral breasts, non-focal.   ASSESSMENT/PLAN:   Missed period Concern for pregnancy as pt LMP 01/22/23. Pt feels like she is pregnant. Upreg negative today. Lab appointment scheduled for tomorrow 2/4 at 8:30am for blood pregnancy test (order placed) - If blood test is negative she will need an individual appointment to discuss her irregular menses, and plan for pregnancy if that is her desire - If blood test is positive she will likely need prenatal care at high risk OB due to her hypertension  Primary hypertension Uncontrolled, first blood pressure reading today 144/106, repeat 136/92.  Patient states that she does not take anything for blood pressure.  Reports intermittent headaches but thinks that this may be due to pregnancy. Amlodipine listed in chart however pt states she is not taking.  -Labetalol 100 mg twice daily - Advised patient that she will need an individual appointment to address her blood pressure in 1 to  2 weeks - ED precautions discussed     Para March, DO [facility]

## 2023-02-25 NOTE — Assessment & Plan Note (Addendum)
Concern for pregnancy as pt LMP 01/22/23. Pt feels like she is pregnant. Upreg negative today. Lab appointment scheduled for tomorrow 2/4 at 8:30am for blood pregnancy test (order placed) - If blood test is negative she will need an individual appointment to discuss her irregular menses, and plan for pregnancy if that is her desire - If blood test is positive she will likely need prenatal care at high risk OB due to her hypertension

## 2023-02-25 NOTE — Patient Instructions (Addendum)
It was great to see you today! Thank you for choosing [facility] for your primary care. Theresa Conner was seen for pregnancy test.  Your pregnancy test was negative. Come back tomorrow at 8:30 for the blood test.   Your blood pressure was elevated. I am going to send some medicine to the pharmacy called Labetalol for your blood pressure that is safe for pregnancy   If you haven't already, sign up for My Chart to have easy access to your labs results, and communication with your primary care physician.  We are checking some labs today. If they are abnormal, I will call you. If they are normal, I will send you a MyChart message (if it is active) or a letter in the mail. If you do not hear about your labs in the next 2 weeks, please call the office.  Please arrive 15 minutes before your appointment to ensure smooth check in process.  We appreciate your efforts in making this happen.  Thank you for allowing me to participate in your care, Para March, DO 02/25/2023, 5:09 PM PGY-1, [facility]

## 2023-02-26 ENCOUNTER — Other Ambulatory Visit: Payer: Self-pay

## 2023-02-26 NOTE — Addendum Note (Signed)
Addended by: Jennette Bill on: 02/26/2023 07:57 AM   Modules accepted: Orders

## 2023-03-08 ENCOUNTER — Other Ambulatory Visit: Payer: Medicaid Other

## 2023-03-08 DIAGNOSIS — N926 Irregular menstruation, unspecified: Secondary | ICD-10-CM | POA: Diagnosis not present

## 2023-03-08 DIAGNOSIS — Z202 Contact with and (suspected) exposure to infections with a predominantly sexual mode of transmission: Secondary | ICD-10-CM

## 2023-03-09 LAB — HCV INTERPRETATION

## 2023-03-09 LAB — HIV ANTIBODY (ROUTINE TESTING W REFLEX): HIV Screen 4th Generation wRfx: NONREACTIVE

## 2023-03-09 LAB — HEPATITIS B SURFACE ANTIGEN: Hepatitis B Surface Ag: NEGATIVE

## 2023-03-09 LAB — BETA HCG QUANT (REF LAB): hCG Quant: <1 m[IU]/mL

## 2023-03-09 LAB — RPR: RPR Ser Ql: NONREACTIVE

## 2023-03-09 LAB — HCV AB W REFLEX TO QUANT PCR: HCV Ab: NONREACTIVE

## 2023-03-11 ENCOUNTER — Encounter: Payer: Self-pay | Admitting: Family Medicine

## 2023-04-21 NOTE — Progress Notes (Deleted)
    SUBJECTIVE:   CHIEF COMPLAINT / HPI: STD testing  STD testing  HTN Has previously had elevated BPs in her past few visits, meeting the diagnosis of HTN. Currently not on any medications.   PERTINENT  PMH / PSH: STI, UTI,   OBJECTIVE:   There were no vitals taken for this visit.  General: Awake and Alert in NAD HEENT: NCAT. Sclera anicteric. No rhinorrhea. Cardiovascular: RRR. No M/R/G Respiratory: CTAB, normal WOB on RA. No wheezing, crackles, rhonchi, or diminished breath sounds. Abdomen: Soft, non-tender, non-distended. Bowel sounds normoactive/hypoactive/hyperactive. *** Extremities: Able to move all extremities. No BLE edema, no deformities or significant joint findings. Skin: Warm and dry. No abrasions or rashes noted. Neuro: A&Ox***. No focal neurological deficits.  ASSESSMENT/PLAN:   Assessment & Plan      Fortunato Curling, DO [facility]

## 2023-04-26 ENCOUNTER — Ambulatory Visit: Admitting: Family Medicine

## 2023-04-29 ENCOUNTER — Ambulatory Visit

## 2023-05-21 ENCOUNTER — Ambulatory Visit

## 2023-09-17 ENCOUNTER — Ambulatory Visit (HOSPITAL_COMMUNITY)
Admission: EM | Admit: 2023-09-17 | Discharge: 2023-09-17 | Disposition: A | Discharge status: Home / Self Care | Source: Ambulatory Visit | Attending: Internal Medicine | Admitting: Internal Medicine

## 2023-09-17 ENCOUNTER — Encounter (HOSPITAL_COMMUNITY): Payer: Self-pay | Admitting: Emergency Medicine

## 2023-09-17 DIAGNOSIS — R519 Headache, unspecified: Secondary | ICD-10-CM

## 2023-09-17 MED ORDER — IBUPROFEN 800 MG PO TABS
ORAL_TABLET | ORAL | Status: AC
  Filled 2023-09-17: qty 1

## 2023-09-17 MED ORDER — IBUPROFEN 800 MG PO TABS
800.0000 mg | ORAL_TABLET | Freq: Once | ORAL | Status: AC
  Administered 2023-09-17: 800 mg via ORAL
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
## 2023-09-20 ENCOUNTER — Ambulatory Visit
Admission: RE | Admit: 2023-09-20 | Discharge: 2023-09-20 | Disposition: A | Discharge status: Home / Self Care | Source: Ambulatory Visit | Attending: Family Medicine | Admitting: Family Medicine

## 2023-09-20 ENCOUNTER — Ambulatory Visit (INDEPENDENT_AMBULATORY_CARE_PROVIDER_SITE_OTHER): Admitting: Family Medicine

## 2023-09-20 VITALS — BP 127/87 | HR 82 | Ht 61.0 in | Wt 220.6 lb

## 2023-09-20 DIAGNOSIS — N926 Irregular menstruation, unspecified: Secondary | ICD-10-CM | POA: Diagnosis not present

## 2023-09-20 DIAGNOSIS — M25512 Pain in left shoulder: Secondary | ICD-10-CM | POA: Diagnosis not present

## 2023-09-20 DIAGNOSIS — S161XXA Strain of muscle, fascia and tendon at neck level, initial encounter: Secondary | ICD-10-CM

## 2023-09-20 MED ORDER — NAPROXEN 500 MG PO TABS
500.0000 mg | ORAL_TABLET | Freq: Two times a day (BID) | ORAL | 0 refills | Status: DC
Start: 2023-09-20 — End: 2023-09-20

## 2023-09-20 NOTE — Patient Instructions (Signed)
 It was wonderful to see you today! Thank you for choosing [facility].   Please bring ALL of your medications with you to every visit.   Today we talked about:  I ordered an x-ray to be done of your left clavicle given your pain.  You can have this done at [address] where you walk-in at your convenience and have the imaging done that will come to my inbox. For your neck strain and left shoulder pain I prescribed you an oral anti-inflammatory you can take twice per day for the next 2 weeks.  You can also use Tylenol  up to 1000 mg 4 times per day for pain relief.  Please continue to move your neck and do range of motion exercises of your shoulder, I will get better with time.  Please follow up as needed for persistent symptoms  Call the clinic at [phone] if your symptoms worsen or you have any concerns.  Please be sure to schedule follow up at the front desk before you leave today.   Izetta Nap, DO Family Medicine

## 2023-09-20 NOTE — Assessment & Plan Note (Signed)
 When asked during office visit if there is any chance of pregnancy patient replied no.  Upon review of LMP and 08/17/2023 patient over a month since her last cycle.  Return phone call, reports she did have unprotected sex mid July but did take Plan B in the last couple of days.  Advised patient to take home pregnancy test and to avoid taking any NSAIDs including previously discussed naproxen  until confirmed pregnancy status.  Patient to follow-up with pregnancy test results and return for blood work if indicated.

## 2023-09-24 ENCOUNTER — Ambulatory Visit: Payer: Self-pay | Admitting: Family Medicine

## 2023-10-11 ENCOUNTER — Ambulatory Visit: Admitting: Family Medicine

## 2023-10-11 ENCOUNTER — Encounter: Payer: Self-pay | Admitting: Family Medicine

## 2023-10-11 ENCOUNTER — Ambulatory Visit: Payer: Self-pay | Admitting: Family Medicine

## 2023-10-11 ENCOUNTER — Other Ambulatory Visit (HOSPITAL_COMMUNITY)
Admission: RE | Admit: 2023-10-11 | Discharge: 2023-10-11 | Disposition: A | Discharge status: Home / Self Care | Source: Ambulatory Visit | Attending: Family Medicine | Admitting: Family Medicine

## 2023-10-11 VITALS — BP 123/79 | HR 88 | Ht 61.0 in | Wt 227.6 lb

## 2023-10-11 DIAGNOSIS — Z113 Encounter for screening for infections with a predominantly sexual mode of transmission: Secondary | ICD-10-CM | POA: Diagnosis not present

## 2023-10-11 DIAGNOSIS — Z3200 Encounter for pregnancy test, result unknown: Secondary | ICD-10-CM | POA: Diagnosis not present

## 2023-10-11 DIAGNOSIS — M5412 Radiculopathy, cervical region: Secondary | ICD-10-CM | POA: Diagnosis not present

## 2023-10-11 LAB — POCT URINE PREGNANCY: Preg Test, Ur: NEGATIVE

## 2023-10-11 MED ORDER — DICLOFENAC SODIUM 1 % EX GEL: 2.0000 g | Freq: Four times a day (QID) | CUTANEOUS | 1 refills | Status: AC | PRN
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
## 2023-10-14 ENCOUNTER — Encounter: Payer: Self-pay | Admitting: Family Medicine

## 2023-10-14 LAB — CERVICOVAGINAL ANCILLARY ONLY
Chlamydia: NEGATIVE
Comment: NEGATIVE
Comment: NEGATIVE
Comment: NORMAL
Neisseria Gonorrhea: NEGATIVE
Trichomonas: NEGATIVE

## 2023-10-17 ENCOUNTER — Other Ambulatory Visit

## 2023-10-25 ENCOUNTER — Other Ambulatory Visit

## 2023-11-06 ENCOUNTER — Other Ambulatory Visit

## 2023-11-08 ENCOUNTER — Ambulatory Visit
Admission: RE | Admit: 2023-11-08 | Discharge: 2023-11-08 | Disposition: A | Source: Ambulatory Visit | Attending: Family Medicine

## 2023-11-08 ENCOUNTER — Ambulatory Visit
Admission: RE | Admit: 2023-11-08 | Discharge: 2023-11-08 | Disposition: A | Discharge status: Home / Self Care | Source: Ambulatory Visit | Attending: Family Medicine | Admitting: Family Medicine
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
## 2023-11-13 ENCOUNTER — Ambulatory Visit: Attending: Family Medicine | Admitting: Rehabilitation

## 2023-11-13 DIAGNOSIS — R293 Abnormal posture: Secondary | ICD-10-CM | POA: Insufficient documentation

## 2023-11-13 DIAGNOSIS — M5412 Radiculopathy, cervical region: Secondary | ICD-10-CM | POA: Insufficient documentation

## 2023-11-13 DIAGNOSIS — M6281 Muscle weakness (generalized): Secondary | ICD-10-CM | POA: Insufficient documentation

## 2023-11-13 DIAGNOSIS — M542 Cervicalgia: Secondary | ICD-10-CM | POA: Diagnosis present

## 2023-11-20 ENCOUNTER — Encounter: Payer: Self-pay | Admitting: Rehabilitation

## 2023-11-20 ENCOUNTER — Ambulatory Visit: Admitting: Rehabilitation

## 2023-11-20 DIAGNOSIS — M542 Cervicalgia: Secondary | ICD-10-CM

## 2023-11-20 DIAGNOSIS — M5412 Radiculopathy, cervical region: Secondary | ICD-10-CM

## 2023-11-20 DIAGNOSIS — M6281 Muscle weakness (generalized): Secondary | ICD-10-CM

## 2023-11-20 DIAGNOSIS — R293 Abnormal posture: Secondary | ICD-10-CM
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
## 2023-11-21 ENCOUNTER — Ambulatory Visit: Admitting: Rehabilitation

## 2023-11-25 ENCOUNTER — Ambulatory Visit: Attending: Family Medicine | Admitting: Rehabilitation

## 2023-11-25 ENCOUNTER — Encounter: Payer: Self-pay | Admitting: Rehabilitation

## 2023-11-25 DIAGNOSIS — R293 Abnormal posture: Secondary | ICD-10-CM | POA: Diagnosis not present

## 2023-11-25 DIAGNOSIS — M5412 Radiculopathy, cervical region: Secondary | ICD-10-CM | POA: Insufficient documentation

## 2023-11-25 DIAGNOSIS — M6281 Muscle weakness (generalized): Secondary | ICD-10-CM | POA: Diagnosis not present

## 2023-11-25 DIAGNOSIS — M542 Cervicalgia: Secondary | ICD-10-CM | POA: Diagnosis not present
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
## 2023-11-27 ENCOUNTER — Encounter: Payer: Self-pay | Admitting: Rehabilitation

## 2023-11-27 ENCOUNTER — Ambulatory Visit: Admitting: Rehabilitation

## 2023-11-27 DIAGNOSIS — M542 Cervicalgia: Secondary | ICD-10-CM | POA: Diagnosis not present

## 2023-11-27 DIAGNOSIS — R293 Abnormal posture: Secondary | ICD-10-CM | POA: Diagnosis not present

## 2023-11-27 DIAGNOSIS — M6281 Muscle weakness (generalized): Secondary | ICD-10-CM

## 2023-11-27 DIAGNOSIS — M5412 Radiculopathy, cervical region: Secondary | ICD-10-CM | POA: Diagnosis not present
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
## 2023-12-02 ENCOUNTER — Encounter: Payer: Self-pay | Admitting: Rehabilitation

## 2023-12-02 ENCOUNTER — Ambulatory Visit: Admitting: Rehabilitation

## 2023-12-02 DIAGNOSIS — M5412 Radiculopathy, cervical region: Secondary | ICD-10-CM | POA: Diagnosis not present

## 2023-12-02 DIAGNOSIS — M6281 Muscle weakness (generalized): Secondary | ICD-10-CM

## 2023-12-02 DIAGNOSIS — R293 Abnormal posture: Secondary | ICD-10-CM | POA: Diagnosis not present

## 2023-12-02 DIAGNOSIS — M542 Cervicalgia: Secondary | ICD-10-CM
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
## 2023-12-04 ENCOUNTER — Ambulatory Visit

## 2023-12-04 DIAGNOSIS — R293 Abnormal posture: Secondary | ICD-10-CM

## 2023-12-04 DIAGNOSIS — M6281 Muscle weakness (generalized): Secondary | ICD-10-CM

## 2023-12-04 DIAGNOSIS — M5412 Radiculopathy, cervical region: Secondary | ICD-10-CM

## 2023-12-04 DIAGNOSIS — M542 Cervicalgia: Secondary | ICD-10-CM | POA: Diagnosis not present
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
## 2023-12-09 ENCOUNTER — Ambulatory Visit: Admitting: Rehabilitation

## 2023-12-10 ENCOUNTER — Other Ambulatory Visit: Payer: Self-pay | Admitting: Family Medicine

## 2023-12-10 DIAGNOSIS — B3731 Acute candidiasis of vulva and vagina: Secondary | ICD-10-CM

## 2023-12-10 MED ORDER — FLUCONAZOLE 150 MG PO TABS: 150.0000 mg | ORAL_TABLET | Freq: Once | ORAL | 0 refills | Status: AC

## 2023-12-10 NOTE — Progress Notes (Signed)
 Patient asked for diflucan  since she has yeast type symptoms again. She came to an appointment for her son. She says her previous PCP used to send in this prescription. Sometimes patient has yeast vaginosis symptoms when she gets her period. Has not been evaluated for diabetes or other causes of frequent yeast infections. I recommended patient make an appt to discuss this.

## 2023-12-11 ENCOUNTER — Ambulatory Visit: Admitting: Rehabilitation
